# Patient Record
Sex: Female | Born: 1960 | Race: White | Hispanic: No | Smoking: Former smoker
Health system: Southern US, Community
[De-identification: ages and names within clinical notes are randomized; demographics above are authoritative.]

## PROBLEM LIST (undated history)

## (undated) DIAGNOSIS — I10 Essential (primary) hypertension: Secondary | ICD-10-CM

## (undated) DIAGNOSIS — F32A Depression, unspecified: Secondary | ICD-10-CM

## (undated) DIAGNOSIS — R011 Cardiac murmur, unspecified: Secondary | ICD-10-CM

## (undated) DIAGNOSIS — F329 Major depressive disorder, single episode, unspecified: Secondary | ICD-10-CM

## (undated) DIAGNOSIS — K219 Gastro-esophageal reflux disease without esophagitis: Secondary | ICD-10-CM

## (undated) DIAGNOSIS — M1990 Unspecified osteoarthritis, unspecified site: Secondary | ICD-10-CM

## (undated) HISTORY — PX: CAPSULOTOMY METATARSOPHALANGEAL: SHX6614

## (undated) HISTORY — PX: HARDWARE REMOVAL: SHX979

---

## 1998-05-11 HISTORY — PX: BREAST BIOPSY: SHX20

## 2005-02-03 ENCOUNTER — Ambulatory Visit: Payer: Self-pay | Admitting: Family Medicine

## 2005-03-25 ENCOUNTER — Other Ambulatory Visit: Payer: Self-pay

## 2005-03-26 ENCOUNTER — Ambulatory Visit: Payer: Self-pay | Admitting: Podiatry

## 2006-04-09 ENCOUNTER — Ambulatory Visit: Payer: Self-pay | Admitting: Podiatry

## 2011-10-06 ENCOUNTER — Ambulatory Visit: Payer: Self-pay | Admitting: Family Medicine

## 2013-08-10 ENCOUNTER — Ambulatory Visit: Payer: Self-pay

## 2014-07-09 HISTORY — PX: OSTECTOMY METATARSAL: SUR970

## 2014-09-18 ENCOUNTER — Other Ambulatory Visit: Payer: Self-pay

## 2014-09-18 DIAGNOSIS — Z1231 Encounter for screening mammogram for malignant neoplasm of breast: Secondary | ICD-10-CM

## 2014-09-20 ENCOUNTER — Ambulatory Visit
Admission: RE | Admit: 2014-09-20 | Discharge: 2014-09-20 | Disposition: A | Discharge status: Home / Self Care | Payer: BLUE CROSS/BLUE SHIELD | Source: Ambulatory Visit | Attending: Family Medicine | Admitting: Family Medicine

## 2014-09-20 DIAGNOSIS — Z1231 Encounter for screening mammogram for malignant neoplasm of breast: Secondary | ICD-10-CM | POA: Diagnosis present

## 2015-02-18 ENCOUNTER — Encounter: Payer: Self-pay | Admitting: General Surgery

## 2015-02-18 ENCOUNTER — Encounter: Payer: BLUE CROSS/BLUE SHIELD | Attending: General Surgery | Admitting: General Surgery

## 2015-02-18 DIAGNOSIS — S91101D Unspecified open wound of right great toe without damage to nail, subsequent encounter: Secondary | ICD-10-CM

## 2015-02-18 DIAGNOSIS — X58XXXS Exposure to other specified factors, sequela: Secondary | ICD-10-CM | POA: Diagnosis not present

## 2015-02-18 DIAGNOSIS — S91101S Unspecified open wound of right great toe without damage to nail, sequela: Secondary | ICD-10-CM | POA: Diagnosis not present

## 2015-02-18 DIAGNOSIS — M1990 Unspecified osteoarthritis, unspecified site: Secondary | ICD-10-CM | POA: Insufficient documentation

## 2015-02-18 DIAGNOSIS — I10 Essential (primary) hypertension: Secondary | ICD-10-CM | POA: Insufficient documentation

## 2015-02-18 DIAGNOSIS — S91101A Unspecified open wound of right great toe without damage to nail, initial encounter: Secondary | ICD-10-CM | POA: Insufficient documentation

## 2015-02-18 NOTE — Progress Notes (Signed)
seeiheal 

## 2015-02-19 NOTE — Progress Notes (Addendum)
[name] [name], [name] [name] ([id number]) Visit Report for 02/18/2015 Chief Complaint Document Details Patient Name: [name] [name], [name] [name] 02/18/2015 1:45 Date of Service: PM Medical Record [id number] Number: Patient Account Number: [id number] August 06, 1960 (54 y.o. Treating RN: Clover Mealy, RN, BSN, [place] Sink Date of Birth/Sex: Female) Other Clinician: Primary Care Physician: WHITE, ELIZABETH Treating Mycala Warshawsky Referring Physician: Doristine Mango Physician/Extender: Weeks in Treatment: 0 Information Obtained from: Patient Electronic Signature(s) Signed: 02/18/2015 5:36:04 PM By: Ardath Sax MD Signed: 02/19/2015 8:06:26 AM By: Evlyn Kanner MD, FACS Entered By: Ardath Sax on 02/18/2015 16:51:14 [name] [name] ([id number]) -------------------------------------------------------------------------------- HPI Details Patient Name: [name] [name], [name] [name] 02/18/2015 1:45 Date of Service: PM Medical Record [id number] Number: Patient Account Number: [id number] Apr 19, 1961 (54 y.o. Treating RN: Clover Mealy, RN, BSN, [place] Sink Date of Birth/Sex: Female) Other Clinician: Primary Care Physician: WHITE, ELIZABETH Treating Evlyn Kanner Referring Physician: Doristine Mango Physician/Extender: Weeks in Treatment: 0 Electronic Signature(s) Signed: 02/18/2015 5:36:04 PM By: Ardath Sax MD Signed: 02/19/2015 8:06:26 AM By: Evlyn Kanner MD, FACS Entered By: Ardath Sax on 02/18/2015 16:51:20 AMAYRA, KIEDROWSKI ([id number]) -------------------------------------------------------------------------------- Physical Exam Details Patient Name: [name] [name], [name] [name] 02/18/2015 1:45 Date of Service: PM Medical Record [id number] Number: Patient Account Number: [id number] 01-02-1961 (54 y.o. Treating RN: Clover Mealy, RN, BSN, [place] Sink Date of Birth/Sex: Female) Other Clinician: Primary Care Physician: WHITE, ELIZABETH Treating Evlyn Kanner Referring Physician: Doristine Mango Physician/Extender: Weeks in Treatment: 0 Electronic  Signature(s) Signed: 02/18/2015 5:36:04 PM By: Ardath Sax MD Signed: 02/19/2015 8:06:26 AM By: Evlyn Kanner MD, FACS Entered By: Ardath Sax on 02/18/2015 16:51:28 [name] [name], [name] [name] ([id number]) -------------------------------------------------------------------------------- Physician Orders Details Patient Name: [name] [name], [name] [name] 02/18/2015 1:45 Date of Service: PM Medical Record [id number] Number: Patient Account Number: [id number] August 30, 1960 (54 y.o. Treating RN: Huel Coventry Date of Birth/Sex: Female) Other Clinician: Primary Care Physician: WHITE, ELIZABETH Treating Evlyn Kanner Referring Physician: Doristine Mango Physician/Extender: Tania Ade in Treatment: 0 Verbal / Phone Orders: Yes Clinician: Huel Coventry Read Back and Verified: Yes Diagnosis Coding Wound Cleansing Wound #1 Right,Medial Metatarsal head first o Clean wound with Normal Saline. Anesthetic Wound #1 Right,Medial Metatarsal head first o Topical Lidocaine 4% cream applied to wound bed prior to debridement Primary Wound Dressing Wound #1 Right,Medial Metatarsal head first o Iodoform packing Gauze Secondary Dressing Wound #1 Right,Medial Metatarsal head first o Boardered Foam Dressing Dressing Change Frequency Wound #1 Right,Medial Metatarsal head first o Change dressing every day. Follow-up Appointments Wound #1 Right,Medial Metatarsal head first o Return Appointment in 1 week. Electronic Signature(s) Signed: 02/18/2015 5:41:06 PM By: Elliot Gurney RN, BSN, Kim RN, BSN Signed: 02/19/2015 8:06:26 AM By: Evlyn Kanner MD, FACS Entered By: Elliot Gurney RN, BSN, Kim on 02/18/2015 14:56:18 LEO, WEYANDT ([id number]) -------------------------------------------------------------------------------- Problem List Details Patient Name: [name] [name], [name] [name] 02/18/2015 1:45 Date of Service: PM Medical Record [id number] Number: Patient Account Number: [id number] 1961/03/11 (54 y.o. Treating RN: Clover Mealy, RN, BSN,  [place] Sink Date of Birth/Sex: Female) Other Clinician: Primary Care Physician: WHITE, ELIZABETH Treating Jimmey Ralph, PETER Referring Physician: Doristine Mango Physician/Extender: Weeks in Treatment: 0 Active Problems ICD-10 Encounter Code Description Active Date Diagnosis S91.101S Unspecified open wound of right great toe without 02/18/2015 Yes damage to nail, sequela Inactive Problems Resolved Problems Electronic Signature(s) Signed: 02/18/2015 5:36:04 PM By: Ardath Sax MD Entered By: Ardath Sax on 02/18/2015 17:34:18 [name] [name] ([id number]) -------------------------------------------------------------------------------- Progress Note Details Patient Name: [name] [name] 02/18/2015 1:45 Date of Service: PM Medical Record [id number] Number: Patient Account Number: [id number] [birth year] (54 y.o. Treating RN: Clover Mealy, RN, BSN, [place] Sink Date of Birth/Sex: Female) Other Clinician: Primary  Care Physician: WHITE, ELIZABETH Treating Evlyn Kanner Referring Physician: Doristine Mango Physician/Extender: Weeks in Treatment: 0 Subjective Chief Complaint Information obtained from Patient Wound History Patient presents with 1 open wound that has been present for approximately 6weeks. Patient has been treating wound in the following manner: neosporin. Laboratory tests have not been performed in the last month. Patient reportedly has not tested positive for an antibiotic resistant organism. Patient reportedly has not tested positive for osteomyelitis. Patient reportedly has not had testing performed to evaluate circulation in the legs. Patient History Information obtained from Patient. Allergies oxycodone (Severity: Moderate, Reaction: rash), Aleve (Severity: Moderate, Reaction: rash) Family History Cancer - Mother, Diabetes - Father, Heart Disease - Mother, Father, Hypertension - Father, Lung Disease - Father, Thyroid Problems - Mother, No family history of Kidney Disease,  Seizures, Stroke. Social History Current every day smoker, Marital Status - Divorced, Alcohol Use - Moderate, Drug Use - No History, Caffeine Use - Daily. Medical History Eyes Denies history of Cataracts, Glaucoma, Optic Neuritis Ear/Nose/Mouth/Throat Denies history of Chronic sinus problems/congestion Hematologic/Lymphatic Denies history of Anemia, Hemophilia, Human Immunodeficiency Virus, Lymphedema, Sickle Cell Disease Respiratory Denies history of Aspiration, Asthma, Chronic Obstructive Pulmonary Disease (COPD), Pneumothorax, Sleep Apnea, Tuberculosis [name] [name], [name] L. ([id number]) Cardiovascular Patient has history of Hypertension Denies history of Angina, Arrhythmia, Congestive Heart Failure, Coronary Artery Disease, Deep Vein Thrombosis, Hypotension, Myocardial Infarction, Peripheral Arterial Disease, Peripheral Venous Disease, Phlebitis, Vasculitis Gastrointestinal Denies history of Cirrhosis , Colitis, Crohn s, Hepatitis A, Hepatitis B, Hepatitis C Endocrine Denies history of Type I Diabetes, Type II Diabetes Genitourinary Denies history of End Stage Renal Disease Immunological Denies history of Lupus Erythematosus, Raynaud s, Scleroderma Integumentary (Skin) Denies history of History of Burn, History of pressure wounds Musculoskeletal Patient has history of Osteoarthritis - hands; feet, knee Denies history of Gout, Rheumatoid Arthritis, Osteomyelitis Neurologic Denies history of Dementia, Neuropathy, Quadriplegia, Paraplegia, Seizure Disorder Oncologic Denies history of Received Chemotherapy, Received Radiation Psychiatric Denies history of Anorexia/bulimia, Confinement Anxiety Hospitalization/Surgery History - 07/09/2014, foot surgery. Medical And Surgical History Notes Constitutional Symptoms (General Health) HTN; heart murmur; depression; Review of Systems (ROS) Constitutional Symptoms (General Health) The patient has no complaints or symptoms. Eyes The  patient has no complaints or symptoms. Ear/Nose/Mouth/Throat The patient has no complaints or symptoms. Hematologic/Lymphatic The patient has no complaints or symptoms. Respiratory The patient has no complaints or symptoms. Cardiovascular The patient has no complaints or symptoms. Gastrointestinal The patient has no complaints or symptoms. Endocrine The patient has no complaints or symptoms. Genitourinary The patient has no complaints or symptoms. Immunological [name] [name], [name] [name] ([id number]) The patient has no complaints or symptoms. Integumentary (Skin) Complains or has symptoms of Wounds, Bleeding or bruising tendency. Denies complaints or symptoms of Breakdown, Swelling. Musculoskeletal The patient has no complaints or symptoms. Neurologic The patient has no complaints or symptoms. Oncologic The patient has no complaints or symptoms. Psychiatric Denies complaints or symptoms of Anxiety, Claustrophobia. Objective Constitutional Vitals Time Taken: 2:06 PM, Height: 67 in, Weight: 198 lbs, BMI: 31, Temperature: 98.6 F, Pulse: 77 bpm, Respiratory Rate: 18 breaths/min, Blood Pressure: 114/66 mmHg. Integumentary (Hair, Skin) Wound #1 status is Open. Original cause of wound was Blister. The wound is located on the Right,Medial Metatarsal head first. The wound measures 0.6cm length x 0.5cm width x 0.2cm depth; 0.236cm^2 area and 0.047cm^3 volume. The wound is limited to skin breakdown. There is undermining starting at 5:00 and ending at 7:00 with a maximum distance of 0.5cm. There is a none present amount of  drainage noted. The wound margin is indistinct and nonvisible. There is no granulation within the wound bed. There is a small (1-33%) amount of necrotic tissue within the wound bed including Adherent Slough. The periwound skin appearance did not exhibit: Callus, Crepitus, Excoriation, Fluctuance, Friable, Induration, Localized Edema, Rash, Scarring, Dry/Scaly, Maceration,  Moist, Atrophie Blanche, Cyanosis, Ecchymosis, Hemosiderin Staining, Mottled, Pallor, Rubor, Erythema. Assessment Active Problems ICD-10 S91.101S - Unspecified open wound of right great toe without damage to nail, sequela [name] [name], [name] L. ([id number]) Procedures DEbrided blister right medial foot. Treat with silver collagen Plan Wound Cleansing: Wound #1 Right,Medial Metatarsal head first: Clean wound with Normal Saline. Anesthetic: Wound #1 Right,Medial Metatarsal head first: Topical Lidocaine 4% cream applied to wound bed prior to debridement Primary Wound Dressing: Wound #1 Right,Medial Metatarsal head first: Iodoform packing Gauze Secondary Dressing: Wound #1 Right,Medial Metatarsal head first: Boardered Foam Dressing Dressing Change Frequency: Wound #1 Right,Medial Metatarsal head first: Change dressing every day. Follow-up Appointments: Wound #1 Right,Medial Metatarsal head first: Return Appointment in 1 week. Follow-Up Appointments: A follow-up appointment should be scheduled. Medication Reconciliation completed and provided to Patient/Care Provider. A Patient Clinical Summary of Care was provided to CE Electronic Signature(s) Signed: 02/22/2015 4:14:39 PM By: Evlyn Kanner MD, FACS Previous Signature: 02/22/2015 4:14:30 PM Version By: Evlyn Kanner MD, FACS Previous Signature: 02/18/2015 5:36:04 PM Version By: Ardath Sax MD Previous Signature: 02/19/2015 8:06:26 AM Version By: Evlyn Kanner MD, FACS Entered By: Evlyn Kanner on 02/22/2015 16:14:39 [name] [name] ([id number]) -------------------------------------------------------------------------------- ROS/PFSH Details Patient Name: [name] [name], [name] [name] 02/18/2015 1:45 Date of Service: PM Medical Record [id number] Number: Patient Account Number: [id number] January 08, 1961 (53 y.o. Treating RN: Huel Coventry Date of Birth/Sex: Female) Other Clinician: Primary Care Physician: WHITE, ELIZABETH Treating Nakeeta Sebastiani,  Jarica Plass Referring Physician: WHITE, Lanora Manis Physician/Extender: Weeks in Treatment: 0 Information Obtained From Patient Wound History Do you currently have one or more open woundso Yes How many open wounds do you currently haveo 1 Approximately how long have you had your woundso 6weeks How have you been treating your wound(s) until nowo neosporin Has your wound(s) ever healed and then re-openedo No Have you had any lab work done in the past montho No Have you tested positive for an antibiotic resistant organism (MRSA, VRE)o No Have you tested positive for osteomyelitis (bone infection)o No Have you had any tests for circulation on your legso No Cardiovascular Complaints and Symptoms: No Complaints or Symptoms Complaints and Symptoms: Negative for: Chest pain; LE edema Medical History: Positive for: Hypertension Negative for: Angina; Arrhythmia; Congestive Heart Failure; Coronary Artery Disease; Deep Vein Thrombosis; Hypotension; Myocardial Infarction; Peripheral Arterial Disease; Peripheral Venous Disease; Phlebitis; Vasculitis Integumentary (Skin) Complaints and Symptoms: Positive for: Wounds; Bleeding or bruising tendency Negative for: Breakdown; Swelling Medical History: Negative for: History of Burn; History of pressure wounds Psychiatric Complaints and Symptoms: Negative for: Anxiety; Claustrophobia Panella, Vivia L. ([id number]) Medical History: Negative for: Anorexia/bulimia; Confinement Anxiety Constitutional Symptoms (General Health) Complaints and Symptoms: No Complaints or Symptoms Medical History: Past Medical History Notes: HTN; heart murmur; depression; Eyes Complaints and Symptoms: No Complaints or Symptoms Medical History: Negative for: Cataracts; Glaucoma; Optic Neuritis Ear/Nose/Mouth/Throat Complaints and Symptoms: No Complaints or Symptoms Medical History: Negative for: Chronic sinus problems/congestion Hematologic/Lymphatic Complaints and  Symptoms: No Complaints or Symptoms Medical History: Negative for: Anemia; Hemophilia; Human Immunodeficiency Virus; Lymphedema; Sickle Cell Disease Respiratory Complaints and Symptoms: No Complaints or Symptoms Medical History: Negative for: Aspiration; Asthma; Chronic Obstructive Pulmonary Disease (COPD); Pneumothorax; Sleep Apnea; Tuberculosis Gastrointestinal Complaints and Symptoms:  No Complaints or Symptoms Medical HistoryRINDI, BEECHY ([id number]) Negative for: Cirrhosis ; Colitis; Crohnos; Hepatitis A; Hepatitis B; Hepatitis C Endocrine Complaints and Symptoms: No Complaints or Symptoms Medical History: Negative for: Type I Diabetes; Type II Diabetes Genitourinary Complaints and Symptoms: No Complaints or Symptoms Medical History: Negative for: End Stage Renal Disease Immunological Complaints and Symptoms: No Complaints or Symptoms Medical History: Negative for: Lupus Erythematosus; Raynaudos; Scleroderma Musculoskeletal Complaints and Symptoms: No Complaints or Symptoms Medical History: Positive for: Osteoarthritis - hands; feet, knee Negative for: Gout; Rheumatoid Arthritis; Osteomyelitis Neurologic Complaints and Symptoms: No Complaints or Symptoms Medical History: Negative for: Dementia; Neuropathy; Quadriplegia; Paraplegia; Seizure Disorder Oncologic Complaints and Symptoms: No Complaints or Symptoms Medical History: Negative for: Received Chemotherapy; Received Radiation Hospitalization / Surgery History MARISOL, GIAMBRA ([id number]) Name of Hospital Purpose of Hospitalization/Surgery Date foot surgery 07/09/2014 Family and Social History Cancer: Yes - Mother; Diabetes: Yes - Father; Heart Disease: Yes - Mother, Father; Hypertension: Yes - Father; Kidney Disease: No; Lung Disease: Yes - Father; Seizures: No; Stroke: No; Thyroid Problems: Yes - Mother; Current every day smoker; Marital Status - Divorced; Alcohol Use: Moderate; Drug Use:  No History; Caffeine Use: Daily; Advanced Directives: No; Living Will: No; Medical Power of Attorney: No Electronic Signature(s) Signed: 02/18/2015 5:41:06 PM By: Elliot Gurney RN, BSN, Kim RN, BSN Signed: 02/19/2015 8:06:26 AM By: Evlyn Kanner MD, FACS Entered By: Elliot Gurney RN, BSN, Kim on 02/18/2015 14:24:16 [name] [name] ([id number]) -------------------------------------------------------------------------------- SuperBill Details Patient Name: [name] [name] Date of Service: 02/18/2015 Medical Record Patient Account Number: [id number] Number: Afful, RN, BSN, Treating RN: Dec 13, 1960 (53 y.o. [place] Sink Date of Birth/Sex: Female) Other Clinician: Primary Care Physician: WHITE, ELIZABETH Treating Jimmey Ralph, PETER Referring Physician: WHITE, ELIZABETH Physician/Extender: Weeks in Treatment: 0 Diagnosis Coding ICD-10 Codes Code Description S91.101S Unspecified open wound of right great toe without damage to nail, sequela Facility Procedures CPT4 Code: [id number] Description: 99213 - WOUND CARE VISIT-LEV 3 EST PT Modifier: Quantity: 1 Physician Procedures CPT4 Code Description: [id number] - WC PHYS LEVEL 2 - NEW PT ICD-10 Description Diagnosis S91.101S Unspecified open wound of right great toe without Modifier: damage to nai Quantity: 1 l, sequela Electronic Signature(s) Signed: 02/18/2015 5:36:04 PM By: Ardath Sax MD Entered By: Ardath Sax on 02/18/2015 17:04:08

## 2015-02-19 NOTE — Progress Notes (Signed)
[name] [name], [name] [name] ([id number]) Visit Report for 02/18/2015 Allergy List Details Patient Name: [name] [name], [name] [name] Date of Service: 02/18/2015 1:45 PM Medical Record Number: [id number] Patient Account Number: [id number] Date of Birth/Sex: 10/05/1960 (54 y.o. Female) Treating RN: Huel Coventry Primary Care Physician: Doristine Mango Other Clinician: Referring Physician: Doristine Mango Treating Physician/Extender: Rudene Re in Treatment: 0 Allergies Active Allergies oxycodone Reaction: rash Severity: Moderate Aleve Reaction: rash Severity: Moderate Allergy Notes Electronic Signature(s) Signed: 02/18/2015 5:41:06 PM By: Elliot Gurney, RN, BSN, Kim RN, BSN Entered By: Elliot Gurney, RN, BSN, Kim on 02/18/2015 14:19:27 [name] [name] ([id number]) -------------------------------------------------------------------------------- Arrival Information Details Patient Name: [name] [name] Date of Service: 02/18/2015 1:45 PM Medical Record Number: [id number] Patient Account Number: [id number] Date of Birth/Sex: Jan 30, 1961 (53 y.o. Female) Treating RN: Huel Coventry Primary Care Physician: Doristine Mango Other Clinician: Referring Physician: Doristine Mango Treating Physician/Extender: Rudene Re in Treatment: 0 Visit Information Patient Arrived: Ambulatory Arrival Time: 14:05 Accompanied By: self Transfer Assistance: Manual Patient Identification Verified: Yes Secondary Verification Process Yes Completed: Patient Has Alerts: Yes Patient Alerts: Patient on Blood Thinner NOT [employer] Electronic Signature(s) Signed: 02/18/2015 5:36:04 PM By: Ardath Sax MD Entered By: Ardath Sax on 02/18/2015 14:15:07 [name] [name] ([id number]) -------------------------------------------------------------------------------- Clinic Level of Care Assessment Details Patient Name: [name] [name] Date of Service: 02/18/2015 1:45 PM Medical Record Number: [id number] Patient Account  Number: [id number] Date of Birth/Sex: [birth year] (53 y.o. Female) Treating RN: Huel Coventry Primary Care Physician: Doristine Mango Other Clinician: Referring Physician: WHITE, Lanora Manis Treating Physician/Extender: Rudene Re in Treatment: 0 Clinic Level of Care Assessment Items TOOL 4 Quantity Score  - Use when only an EandM is performed on FOLLOW-UP visit 0 ASSESSMENTS - Nursing Assessment / Reassessment X - Reassessment of Co-morbidities (includes updates in patient status) 1 10 X - Reassessment of Adherence to Treatment Plan 1 5 ASSESSMENTS - Wound and Skin Assessment / Reassessment X - Simple Wound Assessment / Reassessment - one wound 1 5  - Complex Wound Assessment / Reassessment - multiple wounds 0  - Dermatologic / Skin Assessment (not related to wound area) 0 ASSESSMENTS - Focused Assessment  - Circumferential Edema Measurements - multi extremities 0  - Nutritional Assessment / Counseling / Intervention 0  - Lower Extremity Assessment (monofilament, tuning fork, pulses) 0  - Peripheral Arterial Disease Assessment (using hand held doppler) 0 ASSESSMENTS - Ostomy and/or Continence Assessment and Care  - Incontinence Assessment and Management 0  - Ostomy Care Assessment and Management (repouching, etc.) 0 PROCESS - Coordination of Care X - Simple Patient / Family Education for ongoing care 1 15  - Complex (extensive) Patient / Family Education for ongoing care 0 X - Staff obtains Chiropractor, Records, Test Results / Process Orders 1 10  - Staff telephones HHA, Nursing Homes / Clarify orders / etc 0  - Routine Transfer to another Facility (non-emergent condition) 0 Santy, Erum L. ([id number])  - Routine Hospital Admission (non-emergent condition) 0 X - New Admissions / Manufacturing engineer / Ordering NPWT, Apligraf, etc. 1 15  - Emergency Hospital Admission (emergent condition) 0 X - Simple Discharge Coordination 1 10  - Complex  (extensive) Discharge Coordination 0 PROCESS - Special Needs  - Pediatric / Minor Patient Management 0  - Isolation Patient Management 0  - Hearing / Language / Visual special needs 0  - Assessment of Community assistance (transportation, D/C planning, etc.) 0  - Additional assistance / Altered mentation 0  - Support Surface(s) Assessment (bed, cushion, seat, etc.) 0  INTERVENTIONS - Wound Cleansing / Measurement X - Simple Wound Cleansing - one wound 1 5 []  - Complex Wound Cleansing - multiple wounds 0 X - Wound Imaging (photographs - any number of wounds) 1 5 []  - Wound Tracing (instead of photographs) 0 X - Simple Wound Measurement - one wound 1 5 []  - Complex Wound Measurement - multiple wounds 0 INTERVENTIONS - Wound Dressings X - Small Wound Dressing one or multiple wounds 1 10 []  - Medium Wound Dressing one or multiple wounds 0 []  - Large Wound Dressing one or multiple wounds 0 []  - Application of Medications - topical 0 []  - Application of Medications - injection 0 INTERVENTIONS - Miscellaneous []  - External ear exam 0 [name] [name], [name] L. ([id number]) []  - Specimen Collection (cultures, biopsies, blood, body fluids, etc.) 0 []  - Specimen(s) / Culture(s) sent or taken to Lab for analysis 0 []  - Patient Transfer (multiple staff / Michiel Sites Lift / Similar devices) 0 []  - Simple Staple / Suture removal (25 or less) 0 []  - Complex Staple / Suture removal (26 or more) 0 []  - Hypo / Hyperglycemic Management (close monitor of Blood Glucose) 0 X - Ankle / Brachial Index (ABI) - do not check if billed separately 1 15 X - Vital Signs 1 5 Has the patient been seen at the hospital within the last three years: Yes Total Score: 115 Level Of Care: New/Established - Level 3 Electronic Signature(s) Signed: 02/18/2015 5:41:06 PM By: Elliot Gurney, RN, BSN, Kim RN, BSN Entered By: Elliot Gurney, RN, BSN, Kim on 02/18/2015 14:57:08 [name] [name]  ([id number]) -------------------------------------------------------------------------------- Encounter Discharge Information Details Patient Name: [name] [name] Date of Service: 02/18/2015 1:45 PM Medical Record Number: [id number] Patient Account Number: [id number] Date of Birth/Sex: Mar 05, 1961 (53 y.o. Female) Treating RN: Afful, RN, BSN, [place] Primary Care Physician: Doristine Mango Other Clinician: Referring Physician: Doristine Mango Treating Physician/Extender: Rudene Re in Treatment: 0 Encounter Discharge Information Items Discharge Pain Level: 0 Discharge Condition: Stable Ambulatory Status: Ambulatory Discharge Destination: Home Transportation: Private Auto Accompanied By: self Schedule Follow-up Appointment: Yes Medication Reconciliation completed and provided to Patient/Care Yes Renne Cornick: Provided on Clinical Summary of Care: 02/18/2015 Form Type Recipient Paper Patient CE Electronic Signature(s) Signed: 02/18/2015 5:36:04 PM By: Ardath Sax MD Previous Signature: 02/18/2015 2:53:48 PM Version By: Gwenlyn Perking Entered By: Ardath Sax on 02/18/2015 17:04:32 [name] [name] ([id number]) -------------------------------------------------------------------------------- Lower Extremity Assessment Details Patient Name: [name] [name] Date of Service: 02/18/2015 1:45 PM Medical Record Number: [id number] Patient Account Number: [id number] Date of Birth/Sex: 1960-08-28 (53 y.o. Female) Treating RN: Huel Coventry Primary Care Physician: Doristine Mango Other Clinician: Referring Physician: Doristine Mango Treating Physician/Extender: Rudene Re in Treatment: 0 Edema Assessment Assessed: [Left: No] [Right: No] Edema: [Left: N] [Right: o] Vascular Assessment Pulses: Posterior Tibial Palpable: [Right:Yes] Doppler: [Right:Multiphasic] Dorsalis Pedis Palpable: [Right:Yes] Doppler: [Right:Multiphasic] Extremity colors, hair growth, and  conditions: Extremity Color: [Right:Normal] Hair Growth on Extremity: [Right:No] Temperature of Extremity: [Right:Warm] Capillary Refill: [Right:< 3 seconds] Blood Pressure: Brachial: [Right:120] Dorsalis Pedis: [Left:Dorsalis Pedis: 100] Ankle: Posterior Tibial: [Left:Posterior Tibial: 90] [Right:0.83] Toe Nail Assessment Left: Right: Thick: No Discolored: No Deformed: No Improper Length and Hygiene: No Electronic Signature(s) Signed: 02/18/2015 5:41:06 PM By: Elliot Gurney, RN, BSN, Kim RN, BSN Entered By: Elliot Gurney, RN, BSN, Kim on 02/18/2015 14:11:10 [name] [name] ([id number]) -------------------------------------------------------------------------------- Multi Wound Chart Details Patient Name: [name] [name] Date of Service: 02/18/2015 1:45 PM Medical Record Number: [id number] Patient Account Number: [id number] Date of Birth/Sex: 04-30-1961 (53 y.o.  Female) Treating RN: Huel Coventry Primary Care Physician: Doristine Mango Other Clinician: Referring Physician: WHITE, Lanora Manis Treating Physician/Extender: Rudene Re in Treatment: 0 Vital Signs Height(in): 67 Pulse(bpm): 77 Weight(lbs): 198 Blood Pressure 114/66 (mmHg): Body Mass Index(BMI): 31 Temperature(F): 98.6 Respiratory Rate 18 (breaths/min): Photos: [1:No Photos] [N/A:N/A] Wound Location: [1:Right Metatarsal head first N/A - Medial] Wounding Event: [1:Blister] [N/A:N/A] Primary Etiology: [1:Pressure Ulcer] [N/A:N/A] Date Acquired: [1:01/14/2015] [N/A:N/A] Weeks of Treatment: [1:0] [N/A:N/A] Wound Status: [1:Open] [N/A:N/A] Measurements L x W x D 0.6x0.5x0.2 [N/A:N/A] (cm) Area (cm) : [1:0.236] [N/A:N/A] Volume (cm) : [1:0.047] [N/A:N/A] % Reduction in Area: [1:0.00%] [N/A:N/A] % Reduction in Volume: 0.00% [N/A:N/A] Starting Position 1 5 (o'clock): Ending Position 1 [1:7] (o'clock): Maximum Distance 1 0.5 (cm): Undermining: [1:Yes] [N/A:N/A] Classification: [1:Category/Stage II]  [N/A:N/A] Exudate Amount: [1:None Present] [N/A:N/A] Wound Margin: [1:Indistinct, nonvisible] [N/A:N/A] Granulation Amount: [1:None Present (0%)] [N/A:N/A] Necrotic Amount: [1:Small (1-33%)] [N/A:N/A] Exposed Structures: [1:Fascia: No Fat: No Tendon: No Muscle: No] [N/A:N/A] Joint: No Bone: No Limited to Skin Breakdown Periwound Skin Texture: Edema: No N/A N/A Excoriation: No Induration: No Callus: No Crepitus: No Fluctuance: No Friable: No Rash: No Scarring: No Periwound Skin Maceration: No N/A N/A Moisture: Moist: No Dry/Scaly: No Periwound Skin Color: Atrophie Blanche: No N/A N/A Cyanosis: No Ecchymosis: No Erythema: No Hemosiderin Staining: No Mottled: No Pallor: No Rubor: No Tenderness on No N/A N/A Palpation: Wound Preparation: Ulcer Cleansing: N/A N/A Rinsed/Irrigated with Saline Topical Anesthetic Applied: Other: liodocaine 4% Treatment Notes Electronic Signature(s) Signed: 02/18/2015 5:41:06 PM By: Elliot Gurney, RN, BSN, Kim RN, BSN Entered By: Elliot Gurney, RN, BSN, Kim on 02/18/2015 14:28:53 [name] [name] ([id number]) -------------------------------------------------------------------------------- Multi-Disciplinary Care Plan Details Patient Name: [name] [name] Date of Service: 02/18/2015 1:45 PM Medical Record Number: [id number] Patient Account Number: [id number] Date of Birth/Sex: 08/10/1960 (53 y.o. Female) Treating RN: Huel Coventry Primary Care Physician: Doristine Mango Other Clinician: Referring Physician: WHITE, Lanora Manis Treating Physician/Extender: Rudene Re in Treatment: 0 Active Inactive Orientation to the Wound Care Program Nursing Diagnoses: Knowledge deficit related to the wound healing center program Goals: Patient/caregiver will verbalize understanding of the Wound Healing Center Program Date Initiated: 02/18/2015 Goal Status: Active Interventions: Provide education on orientation to the wound  center Notes: Pressure Nursing Diagnoses: Knowledge deficit related to management of pressures ulcers Potential for impaired tissue integrity related to pressure, friction, moisture, and shear Goals: Patient will remain free of pressure ulcers Date Initiated: 02/18/2015 Goal Status: Active Interventions: Assess potential for pressure ulcer upon admission and as needed Notes: Wound/Skin Impairment Nursing Diagnoses: Knowledge deficit related to ulceration/compromised skin integrity Goals: Ulcer/skin breakdown will heal within 14 weeks [name] [name], [name] L. ([id number]) Date Initiated: 02/18/2015 Goal Status: Active Interventions: Assess patient/caregiver ability to perform ulcer/skin care regimen upon admission and as needed Treatment Activities: Topical wound management initiated : 02/18/2015 Notes: Electronic Signature(s) Signed: 02/18/2015 5:41:06 PM By: Elliot Gurney, RN, BSN, Kim RN, BSN Entered By: Elliot Gurney, RN, BSN, Kim on 02/18/2015 14:28:30 [name] [name] ([id number]) -------------------------------------------------------------------------------- Pain Assessment Details Patient Name: [name] [name] Date of Service: 02/18/2015 1:45 PM Medical Record Number: [id number] Patient Account Number: [id number] Date of Birth/Sex: 09-29-1960 (54 y.o. Female) Treating RN: Huel Coventry Primary Care Physician: Doristine Mango Other Clinician: Referring Physician: Doristine Mango Treating Physician/Extender: Rudene Re in Treatment: 0 Active Problems Location of Pain Severity and Description of Pain Patient Has Paino No Site Locations Pain Management and Medication Current Pain Management: Electronic Signature(s) Signed: 02/18/2015 5:41:06 PM By: Elliot Gurney, RN, BSN, Kim RN, BSN Entered By:  Elliot Gurney, RN, BSN, Kim on 02/18/2015 14:06:42 [name] [name], [name] [name] ([id number]) -------------------------------------------------------------------------------- Patient/Caregiver Education  Details Patient Name: [name] [name] Date of Service: 02/18/2015 1:45 PM Medical Record Number: [id number] Patient Account Number: [id number] Date of Birth/Gender: 01-Feb-1961 (54 y.o. Female) Treating RN: Huel Coventry Primary Care Physician: Doristine Mango Other Clinician: Referring Physician: Doristine Mango Treating Physician/Extender: Rudene Re in Treatment: 0 Education Assessment Education Provided To: Patient Education Topics Provided Wound/Skin Impairment: Handouts: Caring for Your Ulcer, Other: offloading and wound care as prescribed Electronic Signature(s) Signed: 02/18/2015 5:36:04 PM By: Ardath Sax MD Entered By: Ardath Sax on 02/18/2015 17:04:44 [name] [name] ([id number]) -------------------------------------------------------------------------------- Wound Assessment Details Patient Name: [name] [name] Date of Service: 02/18/2015 1:45 PM Medical Record Number: [id number] Patient Account Number: [id number] Date of Birth/Sex: [birth year] (54 y.o. Female) Treating RN: Huel Coventry Primary Care Physician: Doristine Mango Other Clinician: Referring Physician: WHITE, Lanora Manis Treating Physician/Extender: Rudene Re in Treatment: 0 Wound Status Wound Number: 1 Primary Etiology: Pressure Ulcer Wound Location: Right Metatarsal head first - Wound Status: Open Medial Wounding Event: Blister Date Acquired: 01/14/2015 Weeks Of Treatment: 0 Clustered Wound: No Photos Photo Uploaded By: Elliot Gurney, RN, BSN, Kim on 02/18/2015 18:18:45 Wound Measurements Length: (cm) 0.6 Width: (cm) 0.5 Depth: (cm) 0.2 Area: (cm) 0.236 Volume: (cm) 0.047 % Reduction in Area: 0% % Reduction in Volume: 0% Undermining: Yes Starting Position (o'clock): 5 Ending Position (o'clock): 7 Maximum Distance: (cm) 0.5 Wound Description Classification: Category/Stage II Wound Margin: Indistinct, nonvisible Exudate Amount: None Present Wound Bed Granulation  Amount: None Present (0%) Exposed Structure Necrotic Amount: Small (1-33%) Fascia Exposed: No Necrotic Quality: Adherent Slough Fat Layer Exposed: No [name] [name], [name] L. ([id number]) Tendon Exposed: No Muscle Exposed: No Joint Exposed: No Bone Exposed: No Limited to Skin Breakdown Periwound Skin Texture Texture Color No Abnormalities Noted: No No Abnormalities Noted: No Callus: No Atrophie Blanche: No Crepitus: No Cyanosis: No Excoriation: No Ecchymosis: No Fluctuance: No Erythema: No Friable: No Hemosiderin Staining: No Induration: No Mottled: No Localized Edema: No Pallor: No Rash: No Rubor: No Scarring: No Moisture No Abnormalities Noted: No Dry / Scaly: No Maceration: No Moist: No Wound Preparation Ulcer Cleansing: Rinsed/Irrigated with Saline Topical Anesthetic Applied: Other: liodocaine 4%, Treatment Notes Wound #1 (Right, Medial Metatarsal head first) 1. Cleansed with: Clean wound with Normal Saline 2. Anesthetic Topical Lidocaine 4% cream to wound bed prior to debridement 4. Dressing Applied: Iodoform packing Gauze 5. Secondary Dressing Applied Kerlix/Conform 6. Footwear/Offloading device applied Surgical shoe Notes felt surrounding wound Electronic Signature(s) Signed: 02/18/2015 5:41:06 PM By: Elliot Gurney, RN, BSN, Kim RN, BSN Entered By: Elliot Gurney, RN, BSN, Kim on 02/18/2015 14:17:34 [name] [name], [name] [name] ([id number]) [name] [name], [name] [name] ([id number]) -------------------------------------------------------------------------------- Vitals Details Patient Name: [name] [name] Date of Service: 02/18/2015 1:45 PM Medical Record Number: [id number] Patient Account Number: [id number] Date of Birth/Sex: October 21, 1960 (54 y.o. Female) Treating RN: Huel Coventry Primary Care Physician: Doristine Mango Other Clinician: Referring Physician: WHITE, Lanora Manis Treating Physician/Extender: Rudene Re in Treatment: 0 Vital Signs Time Taken: 14:06 Temperature (F):  98.6 Height (in): 67 Pulse (bpm): 77 Weight (lbs): 198 Respiratory Rate (breaths/min): 18 Body Mass Index (BMI): 31 Blood Pressure (mmHg): 114/66 Reference Range: 80 - 120 mg / dl Electronic Signature(s) Signed: 02/18/2015 5:41:06 PM By: Elliot Gurney, RN, BSN, Kim RN, BSN Entered By: Elliot Gurney, RN, BSN, Kim on 02/18/2015 14:07:16

## 2015-02-19 NOTE — Progress Notes (Signed)
[name] [name] ([id number]) Visit Report for 02/18/2015 Abuse/Suicide Risk Screen Details Patient Name: [name] [name], [name] [name] 02/18/2015 1:45 Date of Service: PM Medical Record [id number] Number: Patient Account Number: [id number] May 13, 1960 (54 y.o. Treating RN: Huel Coventry Date of Birth/Sex: Female) Other Clinician: Primary Care Physician: WHITE, ELIZABETH Treating Britto, Errol Referring Physician: WHITE, ELIZABETH Physician/Extender: Weeks in Treatment: 0 Abuse/Suicide Risk Screen Items Answer ABUSE/SUICIDE RISK SCREEN: Has anyone close to you tried to hurt or harm you recentlyo No Do you feel uncomfortable with anyone in your familyo No Has anyone forced you do things that you didnot want to doo No Do you have any thoughts of harming yourselfo No Patient displays signs or symptoms of abuse and/or neglect. No Electronic Signature(s) Signed: 02/18/2015 5:41:06 PM By: Elliot Gurney, RN, BSN, Kim RN, BSN Entered By: Elliot Gurney, RN, BSN, Kim on 02/18/2015 14:24:25 [name] [name] ([id number]) -------------------------------------------------------------------------------- Activities of Daily Living Details Patient Name: [name] [name] 02/18/2015 1:45 Date of Service: PM Medical Record [id number] Number: Patient Account Number: [id number] August 12, 1960 (53 y.o. Treating RN: Huel Coventry Date of Birth/Sex: Female) Other Clinician: Primary Care Physician: WHITE, ELIZABETH Treating Evlyn Kanner Referring Physician: Doristine Mango Physician/Extender: Weeks in Treatment: 0 Activities of Daily Living Items Answer Activities of Daily Living (Please select one for each item) Drive Automobile Completely Able Take Medications Completely Able Use Telephone Completely Able Care for Appearance Completely Able Use Toilet Completely Able Bath / Shower Completely Able Dress Self Completely Able Feed Self Completely Able Walk Completely Able Get In / Out Bed Completely Able Housework Completely  Able Prepare Meals Completely Able Handle Money Completely Able Shop for Self Completely Able Electronic Signature(s) Signed: 02/18/2015 5:41:06 PM By: Elliot Gurney, RN, BSN, Kim RN, BSN Entered By: Elliot Gurney, RN, BSN, Kim on 02/18/2015 14:24:39 [name] [name] ([id number]) -------------------------------------------------------------------------------- Education Assessment Details Patient Name: [name] [name] 02/18/2015 1:45 Date of Service: PM Medical Record [id number] Number: Patient Account Number: [id number] 11-17-1960 (53 y.o. Treating RN: Huel Coventry Date of Birth/Sex: Female) Other Clinician: Primary Care Physician: WHITE, ELIZABETH Treating Evlyn Kanner Referring Physician: Doristine Mango Physician/Extender: Tania Ade in Treatment: 0 Learning Preferences/Education Level/Primary Language Learning Preference: Explanation, Demonstration Highest Education Level: College or Above Preferred Language: English Cognitive Barrier Assessment/Beliefs Language Barrier: No Translator Needed: No Memory Deficit: No Emotional Barrier: No Cultural/Religious Beliefs Affecting Medical No Care: Physical Barrier Assessment Impaired Vision: No Impaired Hearing: No Decreased Hand dexterity: No Knowledge/Comprehension Assessment Knowledge Level: High Comprehension Level: High Ability to understand written High instructions: Ability to understand verbal High instructions: Motivation Assessment Anxiety Level: Calm Cooperation: Cooperative Education Importance: Acknowledges Need Interest in Health Problems: Asks Questions Perception: Coherent Willingness to Engage in Self- High Management Activities: Readiness to Engage in Self- High Management Activities: CYNIA, ABRUZZO ([id number]) Electronic Signature(s) Signed: 02/18/2015 5:41:06 PM By: Elliot Gurney, RN, BSN, Kim RN, BSN Entered By: Elliot Gurney, RN, BSN, Kim on 02/18/2015 14:25:21 [name] [name]  ([id number]) -------------------------------------------------------------------------------- Fall Risk Assessment Details Patient Name: [name] [name] 02/18/2015 1:45 Date of Service: PM Medical Record [id number] Number: Patient Account Number: [id number] [birth year] (53 y.o. Treating RN: Huel Coventry Date of Birth/Sex: Female) Other Clinician: Primary Care Physician: WHITE, ELIZABETH Treating Britto, Errol Referring Physician: WHITE, Lanora Manis Physician/Extender: Weeks in Treatment: 0 Fall Risk Assessment Items FALL RISK ASSESSMENT: History of falling - immediate or within 3 months 25 Yes Secondary diagnosis 0 No Ambulatory aid None/bed rest/wheelchair/nurse 0 Yes Crutches/cane/walker 0 No Furniture 0 No IV Access/Saline Lock 0 No Gait/Training Normal/bed rest/immobile 0 Yes Weak 0 No Impaired  0 No Mental Status Oriented to own ability 0 Yes Electronic Signature(s) Signed: 02/18/2015 5:41:06 PM By: Elliot Gurney, RN, BSN, Kim RN, BSN Entered By: Elliot Gurney, RN, BSN, Kim on 02/18/2015 14:25:28 [name] [name] ([id number]) -------------------------------------------------------------------------------- Foot Assessment Details Patient Name: [name] [name] 02/18/2015 1:45 Date of Service: PM Medical Record [id number] Number: Patient Account Number: [id number] [birth year] (53 y.o. Treating RN: Huel Coventry Date of Birth/Sex: Female) Other Clinician: Primary Care Physician: WHITE, ELIZABETH Treating Britto, Errol Referring Physician: WHITE, Lanora Manis Physician/Extender: Weeks in Treatment: 0 Foot Assessment Items Site Locations + = Sensation present, - = Sensation absent, C = Callus, U = Ulcer R = Redness, W = Warmth, M = Maceration, PU = Pre-ulcerative lesion F = Fissure, S = Swelling, D = Dryness Assessment Right: Left: Other Deformity: No No Prior Foot Ulcer: No No Prior Amputation: No No Charcot Joint: No No Ambulatory Status: Ambulatory Without Help Gait:  Steady Electronic Signature(s) Signed: 02/18/2015 5:41:06 PM By: Elliot Gurney, RN, BSN, Kim RN, BSN Entered By: Elliot Gurney, RN, BSN, Kim on 02/18/2015 14:26:08 [name] [name] ([id number]) -------------------------------------------------------------------------------- Nutrition Risk Assessment Details Patient Name: RAEGHAN, DEMETER 02/18/2015 1:45 Date of Service: PM Medical Record [id number] Number: Patient Account Number: [id number] 10/24/1960 (53 y.o. Treating RN: Huel Coventry Date of Birth/Sex: Female) Other Clinician: Primary Care Physician: WHITE, ELIZABETH Treating Britto, Errol Referring Physician: WHITE, ELIZABETH Physician/Extender: Weeks in Treatment: 0 Height (in): 67 Weight (lbs): 198 Body Mass Index (BMI): 31 Nutrition Risk Assessment Items NUTRITION RISK SCREEN: I have an illness or condition that made me change the kind and/or 0 No amount of food I eat I eat fewer than two meals per day 0 No I eat few fruits and vegetables, or milk products 0 No I have three or more drinks of beer, liquor or wine almost every day 0 No I have tooth or mouth problems that make it hard for me to eat 0 No I don't always have enough money to buy the food I need 0 No I eat alone most of the time 0 No I take three or more different prescribed or over-the-counter drugs a 0 No day Without wanting to, I have lost or gained 10 pounds in the last six 0 No months I am not always physically able to shop, cook and/or feed myself 0 No Nutrition Protocols Good Risk Protocol 0 No interventions needed Moderate Risk Protocol Electronic Signature(s) Signed: 02/18/2015 5:41:06 PM By: Elliot Gurney, RN, BSN, Kim RN, BSN Entered By: Elliot Gurney, RN, BSN, Kim on 02/18/2015 14:25:37

## 2015-02-25 ENCOUNTER — Encounter: Payer: BLUE CROSS/BLUE SHIELD | Admitting: Surgery

## 2015-02-25 DIAGNOSIS — S91101S Unspecified open wound of right great toe without damage to nail, sequela: Secondary | ICD-10-CM | POA: Diagnosis not present

## 2015-02-25 NOTE — Progress Notes (Addendum)
[name], [name] ([id number]) Visit Report for 02/25/2015 Chief Complaint Document Details Patient Name: [name] [name], [name] [name] 02/25/2015 3:45 Date of Service: PM Medical Record [id number] Number: Patient Account Number: [id number] 1961-04-15 (54 y.o. Treating RN: Cornell Barman Date of Birth/Sex: Female) Other Clinician: Primary Care Physician: WHITE, ELIZABETH Treating Christin Fudge Referring Physician: Eulogio Bear Physician/Extender: Weeks in Treatment: 1 Information Obtained from: Patient Chief Complaint Patient presents to the wound care center for a consult due non healing wound She's had a large callus on the medial part of her right forefoot and this has been there for over a month. Electronic Signature(s) Signed: 02/25/2015 4:31:59 PM By: Christin Fudge MD, FACS Previous Signature: 02/25/2015 4:04:17 PM Version By: Christin Fudge MD, FACS Entered By: Christin Fudge on 02/25/2015 16:31:59 [name] [name] ([id number]) -------------------------------------------------------------------------------- Debridement Details Patient Name: ARPI, DIEBOLD 02/25/2015 3:45 Date of Service: PM Medical Record [id number] Number: Patient Account Number: [id number] [birth year] (54 y.o. Treating RN: Cornell Barman Date of Birth/Sex: Female) Other Clinician: Primary Care Physician: WHITE, ELIZABETH Treating Mayah Urquidi Referring Physician: WHITE, Benjamine Mola Physician/Extender: Weeks in Treatment: 1 Debridement Performed for Wound #1 Right,Medial Metatarsal head first Assessment: Performed By: Physician Christin Fudge, MD Debridement: Debridement Pre-procedure Yes Verification/Time Out Taken: Start Time: 16:14 Pain Control: Other : lidocaine 4% Level: Skin/Subcutaneous Tissue Total Area Debrided (L x 0.4 (cm) x 0.2 (cm) = 0.08 (cm) W): Tissue and other Viable, Non-Viable, Exudate, Fibrin/Slough, Skin, Subcutaneous material debrided: Instrument: Forceps, Scissors Bleeding:  Minimum Hemostasis Achieved: Pressure End Time: 16:20 Procedural Pain: 1 Post Procedural Pain: 2 Response to Treatment: Procedure was tolerated well Post Debridement Measurements of Total Wound Length: (cm) 0.6 Stage: Category/Stage II Width: (cm) 0.6 Depth: (cm) 0.3 Volume: (cm) 0.085 Post Procedure Diagnosis Same as Pre-procedure Electronic Signature(s) Signed: 02/25/2015 4:31:30 PM By: Christin Fudge MD, FACS Signed: 02/25/2015 6:09:37 PM By: Gretta Cool, RN, BSN, Kim RN, BSN Entered By: Christin Fudge on 02/25/2015 16:31:30 [name] [name] ([id number]) -------------------------------------------------------------------------------- HPI Details Patient Name: [name], [name] 02/25/2015 3:45 Date of Service: PM Medical Record [id number] Number: Patient Account Number: [id number] Apr 04, 1961 (54 y.o. Treating RN: Cornell Barman Date of Birth/Sex: Female) Other Clinician: Primary Care Physician: WHITE, ELIZABETH Treating Christin Fudge Referring Physician: WHITE, Benjamine Mola Physician/Extender: Weeks in Treatment: 1 History of Present Illness Location: right forefoot medially near her bunion Quality: Patient reports experiencing a dull pain to affected area(s). Severity: Patient states wound are getting worse. Duration: Patient has had the wound for < 5 weeks prior to presenting for treatment Timing: Pain in wound is Intermittent (comes and goes Context: The wound occurred when the patient wore some ill fitting shoes Modifying Factors: she has had abnormality of her toes for a while and has seen her ankle and foot surgeon for the left side. Associated Signs and Symptoms: Patient reports presence of swelling HPI Description: 54 year old patient whose had abnormalities of both feet but her left foot was operated on by a foot and ankle surgeon and she had this in February of this year. He had taken some x-rays of the right foot but not address this deformity yet. She says about a month  ago she was ill fitting shoes for a while and due to this she had a large blister which then opened out and became an ulcer. She's also had some plantar warts on her right foot and has been applying some over-the-counter medication on this. She does not have diabetes mellitus or any other chronic problems but has been addicted to nicotine and has  been smoking about half a pack of cigarettes for about 4 years. Electronic Signature(s) Signed: 02/25/2015 4:35:08 PM By: Christin Fudge MD, FACS Previous Signature: 02/25/2015 4:04:22 PM Version By: Christin Fudge MD, FACS Entered By: Christin Fudge on 02/25/2015 16:35:08 [name] [name] ([id number]) -------------------------------------------------------------------------------- Physical Exam Details Patient Name: [name], [name] 02/25/2015 3:45 Date of Service: PM Medical Record [id number] Number: Patient Account Number: [id number] [birth year] (54 y.o. Treating RN: Cornell Barman Date of Birth/Sex: Female) Other Clinician: Primary Care Physician: WHITE, ELIZABETH Treating Christin Fudge Referring Physician: WHITE, Benjamine Mola Physician/Extender: Weeks in Treatment: 1 Constitutional . Pulse regular. Respirations normal and unlabored. Afebrile. . Eyes Nonicteric. Reactive to light. Ears, Nose, Mouth, and Throat Lips, teeth, and gums WNL.Marland Kitchen Moist mucosa without lesions . Neck supple and nontender. No palpable supraclavicular or cervical adenopathy. Normal sized without goiter. Respiratory WNL. No retractions.. Breath sounds WNL, No rubs, rales, rhonchi, or wheeze.. Cardiovascular Heart rhythm and rate regular, no murmur or gallop.. Pedal Pulses WNL. No clubbing, cyanosis or edema. Chest Breasts symmetical and no nipple discharge.. Breast tissue WNL, no masses, lumps, or tenderness.. Lymphatic No adneopathy. No adenopathy. No adenopathy. Musculoskeletal Adexa without tenderness or enlargement.. Digits and nails w/o clubbing, cyanosis,  infection, petechiae, ischemia, or inflammatory conditions.. Integumentary (Hair, Skin) No suspicious lesions. No crepitus or fluctuance. No peri-wound warmth or erythema. No masses.Marland Kitchen Psychiatric Judgement and insight Intact.. No evidence of depression, anxiety, or agitation.. Notes She's got a large bunion on her right forefoot at the head of the first metatarsal and this has a ulcerated area with overlying dead skin which need sharp debridement. Electronic Signature(s) Signed: 02/25/2015 4:35:59 PM By: Christin Fudge MD, FACS Entered By: Christin Fudge on 02/25/2015 16:35:59 [name] [name] ([id number]) -------------------------------------------------------------------------------- Physician Orders Details Patient Name: MALASHA, KLEPPE 02/25/2015 3:45 Date of Service: PM Medical Record [id number] Number: Patient Account Number: [id number] [birth year] (54 y.o. Treating RN: Cornell Barman Date of Birth/Sex: Female) Other Clinician: Primary Care Physician: WHITE, ELIZABETH Treating Christin Fudge Referring Physician: Eulogio Bear Physician/Extender: Weeks in Treatment: 1 Verbal / Phone Orders: Yes Clinician: Cornell Barman Read Back and Verified: Yes Diagnosis Coding ICD-10 Coding Code Description O53.664Q Unspecified open wound of right great toe without damage to nail, sequela Wound Cleansing Wound #1 Right,Medial Metatarsal head first o Clean wound with Normal Saline. o May Shower, gently pat wound dry prior to applying new dressing. Anesthetic Wound #1 Right,Medial Metatarsal head first o Topical Lidocaine 4% cream applied to wound bed prior to debridement Primary Wound Dressing Wound #1 Right,Medial Metatarsal head first o Iodoform packing Gauze Secondary Dressing Wound #1 Right,Medial Metatarsal head first o Boardered Foam Dressing Dressing Change Frequency Wound #1 Right,Medial Metatarsal head first o Change dressing every day. Follow-up  Appointments Wound #1 Right,Medial Metatarsal head first o Return Appointment in 1 week. Radiology o X-ray, foot - right first met head LISSET, KETCHEM ([id number]) oooo Electronic Signature(s) Signed: 02/25/2015 4:43:13 PM By: Christin Fudge MD, FACS Signed: 02/25/2015 6:09:37 PM By: Gretta Cool RN, BSN, Kim RN, BSN Entered By: Gretta Cool, RN, BSN, Kim on 02/25/2015 16:17:39 Scroggs, Rayvon Char ([id number]) -------------------------------------------------------------------------------- Problem List Details Patient Name: TANZIE, ROTHSCHILD 02/25/2015 3:45 Date of Service: PM Medical Record [id number] Number: Patient Account Number: [id number] 1960/08/25 (53 y.o. 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 M21.611 Bunion of right foot 02/25/2015 Yes F17.218 Nicotine dependence, cigarettes, with other nicotine- 02/25/2015 Yes induced disorders Inactive Problems Resolved Problems Electronic Signature(s) Signed: 02/25/2015 4:31:19 PM By: Christin Fudge MD, FACS Previous Signature: 02/25/2015 4:04:11 PM Version By: Christin Fudge MD, FACS Entered By: Christin Fudge on 02/25/2015 16:31:19 [name] [name] ([id number]) -------------------------------------------------------------------------------- Progress Note Details Patient Name: [name] [name] 02/25/2015 3:45 Date of Service: PM Medical Record [id number] Number: Patient Account Number: [id number] October 10, 1960 (53 y.o. Treating RN: Cornell Barman Date of Birth/Sex: Female) Other Clinician: Primary Care Physician: WHITE, ELIZABETH Treating Christin Fudge Referring Physician: Eulogio Bear Physician/Extender: Weeks in Treatment: 1 Subjective Chief Complaint Information obtained from Patient Patient presents to the wound care center for a consult due non healing wound She's had a large callus on the medial part of her right forefoot and this has been there for over a month. History of Present Illness (HPI) The following HPI elements were documented for the patient's wound: Location: right forefoot medially near her bunion Quality: Patient reports experiencing a dull pain to affected area(s). Severity: Patient states wound are getting worse. Duration: Patient has had the wound for < 5 weeks prior to presenting for treatment Timing: Pain in wound is Intermittent (comes and goes Context: The wound occurred when the patient wore some ill fitting shoes Modifying Factors: she has had abnormality of her toes for a while and has seen her ankle and foot surgeon for the left side. Associated Signs and Symptoms: Patient reports presence of swelling 54 year old patient whose had abnormalities of both feet but her left foot was operated on by a foot and ankle surgeon and she had this in February of this year. He had taken some x-rays of the right foot but not address this deformity yet. She says about a month ago she was ill fitting shoes for a while and due to this she had a large blister which then opened out and became an ulcer. She's also had some plantar warts on her right foot and has been applying some over-the-counter medication on this. She does not have diabetes mellitus or any other chronic problems but has been addicted to nicotine and has been smoking about half a pack of cigarettes for about 4 years. Objective Constitutional Pulse regular. Respirations normal and unlabored. Afebrile. Matsushita, Thelia L. ([id number]) Vitals Time Taken: 1:57 PM, Height: 67 in, Weight: 198 lbs, BMI: 31, Temperature: 98.6 F, Pulse: 84 bpm, Respiratory Rate: 18 breaths/min, Blood  Pressure: 104/67 mmHg. Eyes Nonicteric. Reactive to light. Ears, Nose, Mouth, and Throat Lips, teeth, and gums WNL.Marland Kitchen Moist mucosa without lesions . Neck supple and nontender. No palpable supraclavicular or cervical adenopathy. Normal sized without goiter. Respiratory WNL. No retractions.. Breath sounds WNL, No rubs, rales, rhonchi, or wheeze.. Cardiovascular Heart rhythm and rate regular, no murmur or gallop.. Pedal Pulses WNL. No clubbing, cyanosis or edema. Chest Breasts symmetical and no nipple discharge.. Breast tissue WNL, no masses, lumps, or tenderness.. Lymphatic No adneopathy. No adenopathy. No adenopathy. Musculoskeletal Adexa without tenderness or enlargement.. Digits and nails w/o clubbing, cyanosis, infection, petechiae, ischemia, or inflammatory conditions.Marland Kitchen Psychiatric Judgement and insight Intact.. No evidence of depression, anxiety, or agitation.. General Notes: She's got a large bunion on her right forefoot at the head of the first metatarsal and this has a ulcerated area with overlying dead skin which need sharp debridement. Integumentary (Hair, Skin) No suspicious lesions. No crepitus or fluctuance. No peri-wound warmth or erythema. No masses.Marland Kitchen  Wound #1 status is Open. Original cause of wound was Blister. The wound is located on the Right,Medial Metatarsal head first. The wound measures 0.4cm length x 0.2cm width x 0.4cm depth; 0.063cm^2 area and 0.025cm^3 volume. The wound is limited to skin breakdown. There is a none present amount of drainage noted. The wound margin is indistinct and nonvisible. There is no granulation within the wound bed. There is a small (1-33%) amount of necrotic tissue within the wound bed including Adherent Slough. The periwound skin appearance did not exhibit: Callus, Crepitus, Excoriation, Fluctuance, Friable, Induration, Localized Edema, Rash, Scarring, Dry/Scaly, Maceration, Moist, Atrophie Blanche, Cyanosis, Ecchymosis, Hemosiderin  Staining, Mottled, Pallor, Rubor, Erythema. EVAMARIA, DETORE ([id number]) Assessment Active Problems ICD-10 [phone] - Non-pressure chronic ulcer of other part of right foot with fat layer exposed M21.611 - Bunion of right foot F17.218 - Nicotine dependence, cigarettes, with other nicotine-induced disorders After debriding this wound sharply I have recommended Iodosorb and packing with 1/4 inch gauze strip. we will also put a piece of felt to offload it and she's got off loading shoes. I have recommended an x-ray of this foot to make sure it does not probe down to bone and there is any doubt of osteomyelitis. I have urged her to get an appointment with the foot and ankle surgeon to show him this as he may consider some corrective measures. We have spent some time discussing her nicotine addiction and have strongly counseled her to give up smoking completely. Respiratory benefits alternatives and all the possible complications of smoking have been discussed with her at length. All questions answered and she will come back and see me next week. Procedures Wound #1 Wound #1 is a Pressure Ulcer located on the Right,Medial Metatarsal head first . There was a Skin/Subcutaneous Tissue Debridement ([id number]) debridement with total area of 0.08 sq cm performed by Christin Fudge, MD. with the following instrument(s): Forceps and Scissors to remove Viable and Non-Viable tissue/material including Exudate, Fibrin/Slough, Skin, and Subcutaneous after achieving pain control using Other (lidocaine 4%). A time out was conducted prior to the start of the procedure. A Minimum amount of bleeding was controlled with Pressure. The procedure was tolerated well with a pain level of 1 throughout and a pain level of 2 following the procedure. Post Debridement Measurements: 0.6cm length x 0.6cm width x 0.3cm depth; 0.085cm^3 volume. Post debridement Stage noted as Category/Stage II. Post procedure Diagnosis Wound  #1: Same as Pre-Procedure Lecuyer, Jumana L. ([id number]) Plan Wound Cleansing: Wound #1 Right,Medial Metatarsal head first: Clean wound with Normal Saline. May Shower, gently pat wound dry prior to applying new dressing. Anesthetic: Wound #1 Right,Medial Metatarsal head first: Topical Lidocaine 4% cream applied to wound bed prior to debridement Primary Wound Dressing: Wound #1 Right,Medial Metatarsal head first: Iodoform packing Gauze Secondary Dressing: Wound #1 Right,Medial Metatarsal head first: Boardered Foam Dressing Dressing Change Frequency: Wound #1 Right,Medial Metatarsal head first: Change dressing every day. Follow-up Appointments: Wound #1 Right,Medial Metatarsal head first: Return Appointment in 1 week. Radiology ordered were: X-ray, foot - right first met head After debriding this wound sharply I have recommended Iodosorb and packing with 1/4 inch gauze strip. we will also put a piece of felt to offload it and she's got off loading shoes. I have recommended an x-ray of this foot to make sure it does not probe down to bone and there is any doubt of osteomyelitis. I have urged her to get an appointment with the foot and ankle surgeon to show  him this as he may consider some corrective measures. We have spent some time discussing her nicotine addiction and have strongly counseled her to give up smoking completely. Respiratory benefits alternatives and all the possible complications of smoking have been discussed with her at length. All questions answered and she will come back and see me next week. Electronic Signature(s) MARLIYAH, REID ([id number]) Signed: 02/25/2015 4:37:53 PM By: Christin Fudge MD, FACS Entered By: Christin Fudge on 02/25/2015 16:37:52 [name] [name] ([id number]) -------------------------------------------------------------------------------- SuperBill Details Patient Name: [name] [name] Date of Service: 02/25/2015 Medical Record Number:  [id number] Patient Account Number: [id number] Date of Birth/Sex: 01/17/1961 (54 y.o. Female) Treating RN: Cornell Barman Primary Care Physician: Eulogio Bear Other Clinician: Referring Physician: Eulogio Bear Treating Physician/Extender: Frann Rider in Treatment: 1 Diagnosis Coding ICD-10 Codes Code Description [phone] Non-pressure chronic ulcer of other part of right foot with fat layer exposed M21.611 Bunion of right foot F17.218 Nicotine dependence, cigarettes, with other nicotine-induced disorders Facility Procedures CPT4 Code Description: [id number] 11042 - DEB SUBQ TISSUE 20 SQ CM/< ICD-10 Description Diagnosis L97.512 Non-pressure chronic ulcer of other part of right fo M21.611 Bunion of right foot F17.218 Nicotine dependence, cigarettes, with other nicotine Modifier: ot with fat la -induced disor Quantity: 1 yer exposed ders Physician Procedures CPT4 Code Description: [id number] 11042 - WC PHYS SUBQ TISS 20 SQ CM ICD-10 Description Diagnosis L97.512 Non-pressure chronic ulcer of other part of right fo M21.611 Bunion of right foot F17.218 Nicotine dependence, cigarettes, with other nicotine Modifier: ot with fat lay -induced disord Quantity: 1 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

## 2015-02-26 NOTE — Progress Notes (Signed)
[name] [name], [name] [name] ([id number]) Visit Report for 02/25/2015 Arrival Information Details Patient Name: [name] [name], [name] [name] Date of Service: 02/25/2015 3:45 PM Medical Record Number: [id number] Patient Account Number: [id number] Date of Birth/Sex: Feb 10, 1961 (54 y.o. Female) Treating RN: Huel Coventry Primary Care Physician: Doristine Mango Other Clinician: Referring Physician: WHITE, Lanora Manis Treating Physician/Extender: Rudene Re in Treatment: 1 Visit Information History Since Last Visit Added or deleted any medications: No Patient Arrived: Ambulatory Any new allergies or adverse reactions: No Arrival Time: 15:56 Had a fall or experienced change in No Accompanied By: self activities of daily living that may affect Transfer Assistance: None risk of falls: Patient Identification Yes Signs or symptoms of abuse/neglect No Verified: since last visito Patient Has Alerts: Yes Hospitalized since last visit: No Patient Alerts: Patient on Blood Has Dressing in Place as Prescribed: Yes Thinner Has Footwear/Offloading in Place as Yes NOT Daibetic Prescribed: Right: Surgical Shoe with Pressure Relief Insole Pain Present Now: No Electronic Signature(s) Signed: 02/25/2015 6:09:37 PM By: Elliot Gurney, RN, BSN, Kim RN, BSN Entered By: Elliot Gurney, RN, BSN, Kim on 02/25/2015 15:57:29 [name] [name], [name] [name] ([id number]) -------------------------------------------------------------------------------- Encounter Discharge Information Details Patient Name: [name] [name] Date of Service: 02/25/2015 3:45 PM Medical Record Number: [id number] Patient Account Number: [id number] Date of Birth/Sex: 1961/04/04 (53 y.o. Female) Treating RN: Huel Coventry Primary Care Physician: Doristine Mango Other Clinician: Referring Physician: Doristine Mango Treating Physician/Extender: Rudene Re in Treatment: 1 Encounter Discharge Information Items Discharge Pain Level: 0 Discharge Condition:  Stable Ambulatory Status: Ambulatory Discharge Destination: Home Transportation: Private Auto Accompanied By: self Schedule Follow-up Appointment: Yes Medication Reconciliation completed and provided to Patient/Care Yes [name] [name]: Provided on Clinical Summary of Care: 02/25/2015 Form Type Recipient Paper Patient CE Electronic Signature(s) Signed: 02/25/2015 4:29:43 PM By: Gwenlyn Perking Entered By: Gwenlyn Perking on 02/25/2015 16:29:43 [name] [name] ([id number]) -------------------------------------------------------------------------------- Lower Extremity Assessment Details Patient Name: [name] [name] Date of Service: 02/25/2015 3:45 PM Medical Record Number: [id number] Patient Account Number: [id number] Date of Birth/Sex: 24-Sep-1960 (53 y.o. Female) Treating RN: Huel Coventry Primary Care Physician: Doristine Mango Other Clinician: Referring Physician: WHITE, Lanora Manis Treating Physician/Extender: Rudene Re in Treatment: 1 Vascular Assessment Pulses: Posterior Tibial Dorsalis Pedis Palpable: [Right:Yes] Extremity colors, hair growth, and conditions: Extremity Color: [Right:Normal] Hair Growth on Extremity: [Right:Yes] Temperature of Extremity: [Right:Warm] Capillary Refill: [Right:< 3 seconds] Toe Nail Assessment Left: Right: Thick: No Discolored: No Deformed: No Improper Length and Hygiene: No Electronic Signature(s) Signed: 02/25/2015 6:09:37 PM By: Elliot Gurney, RN, BSN, Kim RN, BSN Entered By: Elliot Gurney, RN, BSN, Kim on 02/25/2015 15:58:44 [name] [name], [name] [name] ([id number]) -------------------------------------------------------------------------------- Multi Wound Chart Details Patient Name: [name] [name] Date of Service: 02/25/2015 3:45 PM Medical Record Number: [id number] Patient Account Number: [id number] Date of Birth/Sex: 12-13-1960 (53 y.o. Female) Treating RN: Huel Coventry Primary Care Physician: Doristine Mango Other Clinician: Referring  Physician: WHITE, Lanora Manis Treating Physician/Extender: Rudene Re in Treatment: 1 Vital Signs Height(in): 67 Pulse(bpm): 84 Weight(lbs): 198 Blood Pressure 104/67 (mmHg): Body Mass Index(BMI): 31 Temperature(F): 98.6 Respiratory Rate 18 (breaths/min): Photos: [1:No Photos] [N/A:N/A] Wound Location: [1:Right Metatarsal head first N/A - Medial] Wounding Event: [1:Blister] [N/A:N/A] Primary Etiology: [1:Pressure Ulcer] [N/A:N/A] Comorbid History: [1:Hypertension, Osteoarthritis] [N/A:N/A] Date Acquired: [1:01/14/2015] [N/A:N/A] Weeks of Treatment: [1:1] [N/A:N/A] Wound Status: [1:Open] [N/A:N/A] Measurements L x W x D 0.4x0.2x0.4 [N/A:N/A] (cm) Area (cm) : [1:0.063] [N/A:N/A] Volume (cm) : [1:0.025] [N/A:N/A] % Reduction in Area: [1:73.30%] [N/A:N/A] % Reduction in Volume: 46.80% [N/A:N/A] Classification: [1:Category/Stage II] [N/A:N/A] Exudate Amount: [1:None Present] [N/A:N/A] Wound  Margin: [1:Indistinct, nonvisible] [N/A:N/A] Granulation Amount: [1:None Present (0%)] [N/A:N/A] Necrotic Amount: [1:Small (1-33%)] [N/A:N/A] Exposed Structures: [1:Fascia: No Fat: No Tendon: No Muscle: No Joint: No Bone: No Limited to Skin Breakdown] [N/A:N/A] Periwound Skin Texture: [N/A:N/A] Edema: No Excoriation: No Induration: No Callus: No Crepitus: No Fluctuance: No Friable: No Rash: No Scarring: No Periwound Skin Maceration: No N/A N/A Moisture: Moist: No Dry/Scaly: No Periwound Skin Color: Atrophie Blanche: No N/A N/A Cyanosis: No Ecchymosis: No Erythema: No Hemosiderin Staining: No Mottled: No Pallor: No Rubor: No Tenderness on No N/A N/A Palpation: Wound Preparation: Ulcer Cleansing: N/A N/A Rinsed/Irrigated with Saline Topical Anesthetic Applied: Other: liodocaine 4% Treatment Notes Electronic Signature(s) Signed: 02/25/2015 6:09:37 PM By: Elliot GurneyWoody, RN, BSN, Kim RN, BSN Entered By: Elliot GurneyWoody, RN, BSN, Kim on 02/25/2015 16:06:24 [name] [name], [name] L.  ([id number]) -------------------------------------------------------------------------------- Multi-Disciplinary Care Plan Details Patient Name: [name] [name], [name] L. Date of Service: 02/25/2015 3:45 PM Medical Record Number: [id number] Patient Account Number: [id number] Date of Birth/Sex: 11/16/1960 (53 y.o. Female) Treating RN: Huel CoventryWoody, Kim Primary Care Physician: Doristine MangoWHITE, ELIZABETH Other Clinician: Referring Physician: WHITE, Lanora ManisELIZABETH Treating Physician/Extender: Rudene ReBritto, Errol Weeks in Treatment: 1 Active Inactive Orientation to the Wound Care Program Nursing Diagnoses: Knowledge deficit related to the wound healing center program Goals: Patient/caregiver will verbalize understanding of the Wound Healing Center Program Date Initiated: 02/18/2015 Goal Status: Active Interventions: Provide education on orientation to the wound center Notes: Pressure Nursing Diagnoses: Knowledge deficit related to management of pressures ulcers Potential for impaired tissue integrity related to pressure, friction, moisture, and shear Goals: Patient will remain free of pressure ulcers Date Initiated: 02/18/2015 Goal Status: Active Interventions: Assess potential for pressure ulcer upon admission and as needed Notes: Wound/Skin Impairment Nursing Diagnoses: Knowledge deficit related to ulceration/compromised skin integrity Goals: Ulcer/skin breakdown will heal within 14 weeks [name] [name], [name] L. ([id number]) Date Initiated: 02/18/2015 Goal Status: Active Interventions: Assess patient/caregiver ability to perform ulcer/skin care regimen upon admission and as needed Treatment Activities: Topical wound management initiated : 02/25/2015 Notes: Electronic Signature(s) Signed: 02/25/2015 6:09:37 PM By: Elliot GurneyWoody, RN, BSN, Kim RN, BSN Entered By: Elliot GurneyWoody, RN, BSN, Kim on 02/25/2015 16:06:17 [name] [name], [name] L. ([id number]) -------------------------------------------------------------------------------- Pain  Assessment Details Patient Name: [name] [name], Mashawn L. Date of Service: 02/25/2015 3:45 PM Medical Record Number: [id number] Patient Account Number: [id number] Date of Birth/Sex: 10/27/1960 (53 y.o. Female) Treating RN: Huel CoventryWoody, Kim Primary Care Physician: Doristine MangoWHITE, ELIZABETH Other Clinician: Referring Physician: Doristine MangoWHITE, ELIZABETH Treating Physician/Extender: Rudene ReBritto, Errol Weeks in Treatment: 1 Active Problems Location of Pain Severity and Description of Pain Patient Has Paino No Site Locations Pain Management and Medication Current Pain Management: Electronic Signature(s) Signed: 02/25/2015 6:09:37 PM By: Elliot GurneyWoody, RN, BSN, Kim RN, BSN Entered By: Elliot GurneyWoody, RN, BSN, Kim on 02/25/2015 15:57:35 [name] [name], Elbony L. ([id number]) -------------------------------------------------------------------------------- Patient/Caregiver Education Details Patient Name: [name] [name], Yolonda L. Date of Service: 02/25/2015 3:45 PM Medical Record Number: [id number] Patient Account Number: [id number] Date of Birth/Gender: 01/23/1961 (53 y.o. Female) Treating RN: Huel CoventryWoody, Kim Primary Care Physician: Doristine MangoWHITE, ELIZABETH Other Clinician: Referring Physician: Doristine MangoWHITE, ELIZABETH Treating Physician/Extender: Rudene ReBritto, Errol Weeks in Treatment: 1 Education Assessment Education Provided To: Patient Education Topics Provided Wound/Skin Impairment: Handouts: Caring for Your Ulcer, Other: continuwe wound care as prescribed Electronic Signature(s) Signed: 02/25/2015 6:09:37 PM By: Elliot GurneyWoody, RN, BSN, Kim RN, BSN Entered By: Elliot GurneyWoody, RN, BSN, Kim on 02/25/2015 16:19:58 [name] [name], Sadi L. ([id number]) -------------------------------------------------------------------------------- Wound Assessment Details Patient Name: [name] [name], Timarie L. Date of Service: 02/25/2015 3:45 PM Medical Record Number: [id number] Patient Account Number: [id number] Date of Birth/Sex: 08/30/1960 (53 y.o.  Female) Treating RN: Huel Coventry Primary Care Physician: Doristine Mango Other Clinician: Referring Physician: WHITE, Lanora Manis Treating Physician/Extender: Rudene Re in Treatment: 1 Wound Status Wound Number: 1 Primary Etiology: Pressure Ulcer Wound Location: Right Metatarsal head first - Wound Status: Open Medial Comorbid History: Hypertension, Osteoarthritis Wounding Event: Blister Date Acquired: 01/14/2015 Weeks Of Treatment: 1 Clustered Wound: No Wound Measurements Length: (cm) 0.4 Width: (cm) 0.2 Depth: (cm) 0.4 Area: (cm) 0.063 Volume: (cm) 0.025 % Reduction in Area: 73.3% % Reduction in Volume: 46.8% Wound Description Classification: Category/Stage II Wound Margin: Indistinct, nonvisible Exudate Amount: None Present Wound Bed Granulation Amount: None Present (0%) Exposed Structure Necrotic Amount: Small (1-33%) Fascia Exposed: No Necrotic Quality: Adherent Slough Fat Layer Exposed: No Tendon Exposed: No Muscle Exposed: No Joint Exposed: No Bone Exposed: No Limited to Skin Breakdown Periwound Skin Texture Texture Color No Abnormalities Noted: No No Abnormalities Noted: No Callus: No Atrophie Blanche: No Crepitus: No Cyanosis: No Excoriation: No Ecchymosis: No Fluctuance: No Erythema: No Friable: No Hemosiderin Staining: No Tri, Brittie L. ([id number]) Induration: No Mottled: No Localized Edema: No Pallor: No Rash: No Rubor: No Scarring: No Moisture No Abnormalities Noted: No Dry / Scaly: No Maceration: No Moist: No Wound Preparation Ulcer Cleansing: Rinsed/Irrigated with Saline Topical Anesthetic Applied: Other: liodocaine 4%, Treatment Notes Wound #1 (Right, Medial Metatarsal head first) 1. Cleansed with: Clean wound with Normal Saline 2. Anesthetic Topical Lidocaine 4% cream to wound bed prior to debridement 4. Dressing Applied: Iodoform packing Gauze 5. Secondary Dressing Applied Kerlix/Conform 7. Secured with Tape Notes felt surrounding wound Electronic  Signature(s) Signed: 02/25/2015 6:09:37 PM By: Elliot Gurney, RN, BSN, Kim RN, BSN Entered By: Elliot Gurney, RN, BSN, Kim on 02/25/2015 16:03:11 [name] [name] ([id number]) -------------------------------------------------------------------------------- Vitals Details Patient Name: [name] [name] Date of Service: 02/25/2015 3:45 PM Medical Record Number: [id number] Patient Account Number: [id number] Date of Birth/Sex: 04-02-1961 (53 y.o. Female) Treating RN: Huel Coventry Primary Care Physician: Doristine Mango Other Clinician: Referring Physician: WHITE, Lanora Manis Treating Physician/Extender: Rudene Re in Treatment: 1 Vital Signs Time Taken: 13:57 Temperature (F): 98.6 Height (in): 67 Pulse (bpm): 84 Weight (lbs): 198 Respiratory Rate (breaths/min): 18 Body Mass Index (BMI): 31 Blood Pressure (mmHg): 104/67 Reference Range: 80 - 120 mg / dl Electronic Signature(s) Signed: 02/25/2015 6:09:37 PM By: Elliot Gurney, RN, BSN, Kim RN, BSN Entered By: Elliot Gurney, RN, BSN, Kim on 02/25/2015 15:57:58

## 2015-02-28 ENCOUNTER — Ambulatory Visit
Admission: RE | Admit: 2015-02-28 | Discharge: 2015-02-28 | Disposition: A | Discharge status: Home / Self Care | Payer: BLUE CROSS/BLUE SHIELD | Source: Intra-hospital | Attending: Surgery | Admitting: Surgery

## 2015-02-28 ENCOUNTER — Ambulatory Visit
Admission: RE | Admit: 2015-02-28 | Discharge: 2015-02-28 | Disposition: A | Discharge status: Home / Self Care | Payer: BLUE CROSS/BLUE SHIELD | Source: Ambulatory Visit | Attending: Surgery | Admitting: Surgery

## 2015-02-28 ENCOUNTER — Other Ambulatory Visit: Payer: Self-pay | Admitting: Surgery

## 2015-02-28 DIAGNOSIS — S81801A Unspecified open wound, right lower leg, initial encounter: Secondary | ICD-10-CM

## 2015-02-28 DIAGNOSIS — S91101A Unspecified open wound of right great toe without damage to nail, initial encounter: Secondary | ICD-10-CM | POA: Diagnosis present

## 2015-02-28 DIAGNOSIS — X58XXXA Exposure to other specified factors, initial encounter: Secondary | ICD-10-CM | POA: Diagnosis not present

## 2015-03-04 ENCOUNTER — Encounter: Payer: BLUE CROSS/BLUE SHIELD | Admitting: Surgery

## 2015-03-04 DIAGNOSIS — S91101S Unspecified open wound of right great toe without damage to nail, sequela: Secondary | ICD-10-CM | POA: Diagnosis not present

## 2015-03-04 NOTE — Progress Notes (Addendum)
JULLIE, [facility] ([id number]) Visit Report for 03/04/2015 Chief Complaint Document Details Patient Name: [name] [name], [name] [name] 03/04/2015 3:30 Date of Service: PM Medical Record [id number] Number: Patient Account Number: [id number] 02/27/1961 (54 y.o. Treating RN: Huel Coventry Date of Birth/Sex: Female) Other Clinician: Primary Care Physician: WHITE, ELIZABETH Treating Evlyn Kanner Referring Physician: Doristine Mango Physician/Extender: Weeks in Treatment: 2 Information Obtained from: Patient Chief Complaint Patient presents to the wound care center for a consult due non healing wound She's had a large callus on the medial part of her right forefoot and this has been there for over a month. Electronic Signature(s) Signed: 03/04/2015 3:59:05 PM By: Evlyn Kanner MD, FACS Entered By: Evlyn Kanner on 03/04/2015 15:59:05 [name] [name] ([id number]) -------------------------------------------------------------------------------- Debridement Details Patient Name: [name] [name] 03/04/2015 3:30 Date of Service: PM Medical Record [id number] Number: Patient Account Number: [id number] Jul 03, 1960 (53 y.o. Treating RN: Huel Coventry Date of Birth/Sex: Female) Other Clinician: Primary Care Physician: WHITE, ELIZABETH Treating Jakayden Cancio Referring Physician: WHITE, Lanora Manis Physician/Extender: Weeks in Treatment: 2 Debridement Performed for Wound #1 Right,Medial Metatarsal head first Assessment: Performed By: Physician Evlyn Kanner, MD Debridement: Debridement Pre-procedure Yes Verification/Time Out Taken: Start Time: 15:47 Pain Control: Other : lidocaine 4% Level: Skin/Subcutaneous Tissue Total Area Debrided (L x 0.3 (cm) x 0.4 (cm) = 0.12 (cm) W): Tissue and other Viable, Non-Viable, Callus, Exudate, Fibrin/Slough, Subcutaneous material debrided: Instrument: Forceps, Scissors Bleeding: Minimum Hemostasis Achieved: Pressure End Time: 15:55 Procedural Pain: 1 Post  Procedural Pain: 1 Response to Treatment: Procedure was tolerated well Post Debridement Measurements of Total Wound Length: (cm) 0.3 Stage: Category/Stage II Width: (cm) 0.4 Depth: (cm) 0.1 Volume: (cm) 0.009 Post Procedure Diagnosis Same as Pre-procedure Electronic Signature(s) Signed: 03/04/2015 3:58:59 PM By: Evlyn Kanner MD, FACS Signed: 03/04/2015 5:43:08 PM By: Elliot Gurney, RN, BSN, Kim RN, BSN Entered By: Evlyn Kanner on 03/04/2015 15:58:58 Raimondi, Lindon Romp ([id number]) -------------------------------------------------------------------------------- HPI Details Patient Name: [name] [name] 03/04/2015 3:30 Date of Service: PM Medical Record [id number] Number: Patient Account Number: [id number] May 08, 1961 (53 y.o. Treating RN: Huel Coventry Date of Birth/Sex: Female) Other Clinician: Primary Care Physician: WHITE, ELIZABETH Treating Evlyn Kanner Referring Physician: Doristine Mango Physician/Extender: Weeks in Treatment: 2 History of Present Illness Location: right forefoot medially near her bunion Quality: Patient reports experiencing a dull pain to affected area(s). Severity: Patient states wound are getting worse. Duration: Patient has had the wound for < 5 weeks prior to presenting for treatment Timing: Pain in wound is Intermittent (comes and goes Context: The wound occurred when the patient wore some ill fitting shoes Modifying Factors: she has had abnormality of her toes for a while and has seen her ankle and foot surgeon for the left side. Associated Signs and Symptoms: Patient reports presence of swelling HPI Description: 54 year old patient whose had abnormalities of both feet but her left foot was operated on by a foot and ankle surgeon and she had this in February of this year. He had taken some x-rays of the right foot but not address this deformity yet. She says about a month ago she was ill fitting shoes for a while and due to this she had a large  blister which then opened out and became an ulcer. She's also had some plantar warts on her right foot and has been applying some over-the-counter medication on this. She does not have diabetes mellitus or any other chronic problems but has been addicted to nicotine and has been smoking about half a pack of cigarettes for about 4  years. 03/04/2015 -- Right foot Xray examination shows no evidence of osteomyelitis. Arthropathic changes are seen at the [facility] and first metatarsal phalangeal joints. she has not yet gotten an appointment with her surgeon for both financial and social reasons. Electronic Signature(s) Signed: 03/04/2015 3:59:27 PM By: Evlyn Kanner MD, FACS Previous Signature: 03/04/2015 3:45:59 PM Version By: Evlyn Kanner MD, FACS Entered By: Evlyn Kanner on 03/04/2015 15:59:27 [name] [name] ([id number]) -------------------------------------------------------------------------------- Physical Exam Details Patient Name: [name] [name] 03/04/2015 3:30 Date of Service: PM Medical Record [id number] Number: Patient Account Number: [id number] Apr 24, 1961 (53 y.o. Treating RN: Huel Coventry Date of Birth/Sex: Female) Other Clinician: Primary Care Physician: WHITE, ELIZABETH Treating Evlyn Kanner Referring Physician: WHITE, Lanora Manis Physician/Extender: Weeks in Treatment: 2 Constitutional . Pulse regular. Respirations normal and unlabored. Afebrile. . Eyes Nonicteric. Reactive to light. Ears, Nose, Mouth, and Throat Lips, teeth, and gums WNL.Marland Kitchen Moist mucosa without lesions . Neck supple and nontender. No palpable supraclavicular or cervical adenopathy. Normal sized without goiter. Respiratory WNL. No retractions.. Cardiovascular Pedal Pulses WNL. No clubbing, cyanosis or edema. Lymphatic No adneopathy. No adenopathy. No adenopathy. Musculoskeletal Adexa without tenderness or enlargement.. Digits and nails w/o clubbing, cyanosis, infection, petechiae, ischemia, or  inflammatory conditions.. Integumentary (Hair, Skin) No suspicious lesions. No crepitus or fluctuance. No peri-wound warmth or erythema. No masses.Marland Kitchen Psychiatric Judgement and insight Intact.. No evidence of depression, anxiety, or agitation.. Notes there is still a overhanging edge and some callus and slough in the subcutaneous region of this wound which will need sharp debridement. Electronic Signature(s) Signed: 03/04/2015 4:00:10 PM By: Evlyn Kanner MD, FACS Entered By: Evlyn Kanner on 03/04/2015 16:00:10 [name] [name] ([id number]) -------------------------------------------------------------------------------- Physician Orders Details Patient Name: ALONDA, WEABER 03/04/2015 3:30 Date of Service: PM Medical Record [id number] Number: Patient Account Number: [id number] Jun 23, 1960 (53 y.o. Treating RN: Huel Coventry Date of Birth/Sex: Female) Other Clinician: Primary Care Physician: WHITE, ELIZABETH Treating Evlyn Kanner Referring Physician: Doristine Mango Physician/Extender: Tania Ade in Treatment: 2 Verbal / Phone Orders: Yes Clinician: Huel Coventry Read Back and Verified: Yes Diagnosis Coding Wound Cleansing Wound #1 Right,Medial Metatarsal head first o Clean wound with Normal Saline. o May Shower, gently pat wound dry prior to applying new dressing. Anesthetic Wound #1 Right,Medial Metatarsal head first o Topical Lidocaine 4% cream applied to wound bed prior to debridement Primary Wound Dressing Wound #1 Right,Medial Metatarsal head first o Iodoflex Secondary Dressing Wound #1 Right,Medial Metatarsal head first o Gauze and Kerlix/Conform - Felt surrounding wound for offloading Dressing Change Frequency Wound #1 Right,Medial Metatarsal head first o Change dressing every other day. Follow-up Appointments Wound #1 Right,Medial Metatarsal head first o Return Appointment in 1 week. Electronic Signature(s) Signed: 03/04/2015 4:31:36 PM By: Evlyn Kanner  MD, FACS Signed: 03/04/2015 5:43:08 PM By: Elliot Gurney RN, BSN, Kim RN, BSN Entered By: Elliot Gurney, RN, BSN, Kim on 03/04/2015 16:11:58 KENESHA, MOSHIER ([id number]) -------------------------------------------------------------------------------- Problem List Details Patient Name: TELICIA, HODGKISS 03/04/2015 3:30 Date of Service: PM Medical Record [id number] Number: Patient Account Number: [id number] 30-Mar-1961 (53 y.o. 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 Treating RN: Huel Coventry Date of Birth/Sex: Female) Other Clinician: Primary Care Physician: WHITE, ELIZABETH Treating Evlyn Kanner Referring Physician: Doristine Mango Physician/Extender: Weeks in Treatment: 2 Subjective Chief Complaint Information obtained from Patient Patient presents to the wound care center for a consult due non healing wound She's had a large callus on the medial part of her right forefoot and this has been there for over a month. History of Present Illness (HPI) The  following HPI elements were documented for the patient's wound: Location: right forefoot medially near her bunion Quality: Patient reports experiencing a dull pain to affected area(s). Severity: Patient states wound are getting worse. Duration: Patient has had the wound for < 5 weeks prior to presenting for treatment Timing: Pain in wound is Intermittent (comes and goes Context: The wound occurred when the patient wore some ill fitting shoes Modifying Factors: she has had abnormality of her toes for a while and has seen her ankle and foot surgeon for the left side. Associated Signs and Symptoms: Patient reports presence of swelling 54 year old patient whose had abnormalities of both feet but her left foot was operated on by a foot and ankle surgeon and she had this in February of this year. He had taken some x-rays of the right foot but not address this deformity yet. She says about a month ago she was ill fitting shoes for a while and due to this she had a large blister which then opened out and became an ulcer. She's also had some plantar warts on her right foot and has been applying some over-the-counter medication on this. She does not have diabetes mellitus or any other chronic problems but has been addicted to nicotine and has been smoking about half a pack of cigarettes for about 4 years. 03/04/2015 -- Right foot Xray examination shows no evidence of osteomyelitis. Arthropathic changes are seen at the [facility] and first metatarsal phalangeal joints. she has not yet gotten an appointment with her surgeon for both financial and social reasons. Guggenheim, Kaisey L. ([id number]) Objective Constitutional Pulse regular. Respirations normal and unlabored. Afebrile. Vitals Time Taken: 3:44 PM, Height: 67 in, Weight: 198 lbs, BMI: 31, Temperature: 98.7 F, Pulse: 89 bpm, Respiratory Rate: 18 breaths/min, Blood Pressure: 120/74 mmHg. Eyes Nonicteric. Reactive to light. Ears, Nose, Mouth, and  Throat Lips, teeth, and gums WNL.Marland Kitchen Moist mucosa without lesions . Neck supple and nontender. No palpable supraclavicular or cervical adenopathy. Normal sized without goiter. Respiratory WNL. No retractions.. Cardiovascular Pedal Pulses WNL. No clubbing, cyanosis or edema. Lymphatic No adneopathy. No adenopathy. No adenopathy. Musculoskeletal Adexa without tenderness or enlargement.. Digits and nails w/o clubbing, cyanosis, infection, petechiae, ischemia, or inflammatory conditions.Marland Kitchen Psychiatric Judgement and insight Intact.. No evidence of depression, anxiety, or agitation.. General Notes: there is still a overhanging edge and some callus and slough in the subcutaneous region of this wound which will need sharp debridement. Integumentary (Hair, Skin) No suspicious lesions. No crepitus or fluctuance. No peri-wound warmth or erythema. No masses.. Wound #1 status is Open. Original cause of wound was Blister. The wound is located on the Right,Medial Metatarsal head first. The wound measures 0.3cm length x 0.3cm width x 0.1cm depth; 0.071cm^2 area and 0.007cm^3 volume. The wound is limited to skin breakdown. There is no tunneling or undermining  noted. There is a none present amount of drainage noted. The wound margin is indistinct and nonvisible. There is large (67-100%) granulation within the wound bed. There is a small (1-33%) amount of necrotic tissue within the wound bed including Adherent Slough. The periwound skin appearance did not exhibit: Callus, Crepitus, Excoriation, Fluctuance, Friable, Induration, Localized Edema, Rash, Scarring, Dry/Scaly, Maceration, Moist, Atrophie Blanche, Cyanosis, Ecchymosis, Hemosiderin Staining, Mottled, Pallor, Rubor, Murtaugh, Drena L. ([id number]) Erythema. Assessment Active Problems ICD-10 L97.512 - Non-pressure chronic ulcer of other part of right foot with fat layer exposed M21.611 - Bunion of right foot F17.218 - Nicotine dependence, cigarettes,  with other nicotine-induced disorders Procedures Wound #1 Wound #1 is a Pressure Ulcer located on the Right,Medial Metatarsal head first . There was a Skin/Subcutaneous Tissue Debridement ([id number]([id number]) debridement with total area of 0.12 sq cm performed by Evlyn KannerBritto, Tarig Zimmers, MD. with the following instrument(s): Forceps and Scissors to remove Viable and Non-Viable tissue/material including Exudate, Fibrin/Slough, Callus, and Subcutaneous after achieving pain control using Other (lidocaine 4%). A time out was conducted prior to the start of the procedure. A Minimum amount of bleeding was controlled with Pressure. The procedure was tolerated well with a pain level of 1 throughout and a pain level of 1 following the procedure. Post Debridement Measurements: 0.3cm length x 0.4cm width x 0.1cm depth; 0.009cm^3 volume. Post debridement Stage noted as Category/Stage II. Post procedure Diagnosis Wound #1: Same as Pre-Procedure Plan Wound Cleansing: Wound #1 Right,Medial Metatarsal head first: Clean wound with Normal Saline. May Shower, gently pat wound dry prior to applying new dressing. Anesthetic: Wound #1 Right,Medial Metatarsal head first: Topical Lidocaine 4% cream applied to wound bed prior to debridement Primary Wound Dressing: Wound #1 Right,Medial Metatarsal head first: Iodoflex Geigle, Quiara L. ([id number]) Secondary Dressing: Wound #1 Right,Medial Metatarsal head first: Gauze and Kerlix/Conform - Felt surrounding wound for offloading Dressing Change Frequency: Wound #1 Right,Medial Metatarsal head first: Change dressing every other day. Follow-up Appointments: Wound #1 Right,Medial Metatarsal head first: Return Appointment in 1 week. After sharply debriding this wound I have recommended Iodosorb to be placed on this area with a small packing strip if possible. I have again reiterated my advice to see her surgeon as this may need corrective surgery before this gets to be worse and  osteomyelitis is a distinct possibility. She understands the treatment plan and will be back to see me next week. Electronic Signature(s) Signed: 03/05/2015 4:30:10 PM By: Evlyn KannerBritto, Duval Macleod MD, FACS Previous Signature: 03/04/2015 4:01:33 PM Version By: Evlyn KannerBritto, Jenia Klepper MD, FACS Entered By: Evlyn KannerBritto, Sargent Mankey on 03/05/2015 16:30:10 [name] BottsEBERLIN, Yuridia L. ([id number]) -------------------------------------------------------------------------------- SuperBill Details Patient Name: [name] BottsEBERLIN, Tanicka L. Date of Service: 03/04/2015 Medical Record Number: [id number] Patient Account Number: [id number] Date of Birth/Sex: 07/18/1960 (53 y.o. Female) Treating RN: Huel CoventryWoody, Kim Primary Care Physician: Doristine MangoWHITE, ELIZABETH Other Clinician: Referring Physician: Doristine MangoWHITE, ELIZABETH Treating Physician/Extender: Rudene ReBritto, Elpidia Karn Weeks in Treatment: 2 Diagnosis Coding ICD-10 Codes Code Description [phone] Non-pressure chronic ulcer of other part of right foot with fat layer exposed M21.611 Bunion of right foot F17.218 Nicotine dependence, cigarettes, with other nicotine-induced disorders Facility Procedures CPT4 Code Description: [id number] 11042 - DEB SUBQ TISSUE 20 SQ CM/< ICD-10 Description Diagnosis L97.512 Non-pressure chronic ulcer of other part of right fo M21.611 Bunion of right foot F17.218 Nicotine dependence, cigarettes, with other nicotine Modifier: ot with fat la -induced disor Quantity: 1 yer exposed ders Physician Procedures CPT4 Code Description: [id number] 11042 - WC PHYS SUBQ TISS 20 SQ CM ICD-10 Description Diagnosis L97.512 Non-pressure chronic ulcer  of other part of right fo M21.611 Bunion of right foot F17.218 Nicotine dependence, cigarettes, with other nicotine Modifier: ot with fat lay -induced disord Quantity: 1 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

## 2015-03-05 NOTE — Progress Notes (Signed)
ABILENE, MCPHEE ([id number]) Visit Report for 03/04/2015 Arrival Information Details Patient Name: [name] [name] Date of Service: 03/04/2015 3:30 PM Medical Record Number: [id number] Patient Account Number: [id number] Date of Birth/Sex: 18-Jun-1960 (54 y.o. Female) Treating RN: Huel Coventry Primary Care Physician: Doristine Mango Other Clinician: Referring Physician: WHITE, Lanora Manis Treating Physician/Extender: Rudene Re in Treatment: 2 Visit Information History Since Last Visit Added or deleted any medications: No Patient Arrived: Ambulatory Any new allergies or adverse reactions: No Arrival Time: 15:44 Had a fall or experienced change in No Accompanied By: self activities of daily living that may affect Transfer Assistance: None risk of falls: Patient Identification Verified: Yes Signs or symptoms of abuse/neglect No Secondary Verification Process Yes since last visito Completed: Hospitalized since last visit: No Patient Has Alerts: Yes Has Dressing in Place as Prescribed: Yes Patient Alerts: Patient on Blood Has Footwear/Offloading in Place as Yes Thinner Prescribed: NOT Daibetic Right: Surgical Shoe with Pressure Relief Insole Pain Present Now: No Electronic Signature(s) Signed: 03/04/2015 5:43:08 PM By: Elliot Gurney, RN, BSN, Kim RN, BSN Entered By: Elliot Gurney, RN, BSN, Kim on 03/04/2015 15:44:40 [name] [name] ([id number]) -------------------------------------------------------------------------------- Encounter Discharge Information Details Patient Name: [name] [name] Date of Service: 03/04/2015 3:30 PM Medical Record Number: [id number] Patient Account Number: [id number] Date of Birth/Sex: December 01, 1960 (53 y.o. Female) Treating RN: Huel Coventry Primary Care Physician: Doristine Mango Other Clinician: Referring Physician: Doristine Mango Treating Physician/Extender: Rudene Re in Treatment: 2 Encounter Discharge Information Items Discharge Pain  Level: 0 Discharge Condition: Stable Ambulatory Status: Ambulatory Discharge Destination: Home Transportation: Private Auto Accompanied By: self Schedule Follow-up Appointment: Yes Medication Reconciliation completed and provided to Patient/Care Yes Parris Cudworth: Provided on Clinical Summary of Care: 03/04/2015 Form Type Recipient Paper Patient CE Electronic Signature(s) Signed: 03/04/2015 5:43:08 PM By: Elliot Gurney RN, BSN, Kim RN, BSN Previous Signature: 03/04/2015 4:04:57 PM Version By: Gwenlyn Perking Entered By: Elliot Gurney RN, BSN, Kim on 03/04/2015 16:13:22 [name] [name] ([id number]) -------------------------------------------------------------------------------- Lower Extremity Assessment Details Patient Name: [name] [name] Date of Service: 03/04/2015 3:30 PM Medical Record Number: [id number] Patient Account Number: [id number] Date of Birth/Sex: 05-22-1960 (53 y.o. Female) Treating RN: Huel Coventry Primary Care Physician: Doristine Mango Other Clinician: Referring Physician: WHITE, Lanora Manis Treating Physician/Extender: Rudene Re in Treatment: 2 Vascular Assessment Pulses: Posterior Tibial Palpable: [Right:Yes] Dorsalis Pedis Palpable: [Right:Yes] Extremity colors, hair growth, and conditions: Extremity Color: [Right:Normal] Temperature of Extremity: [Right:Warm] Capillary Refill: [Right:< 3 seconds] Toe Nail Assessment Left: Right: Thick: No Discolored: No Deformed: No Improper Length and Hygiene: No Electronic Signature(s) Signed: 03/04/2015 5:43:08 PM By: Elliot Gurney, RN, BSN, Kim RN, BSN Entered By: Elliot Gurney, RN, BSN, Kim on 03/04/2015 15:45:59 [name] [name] ([id number]) -------------------------------------------------------------------------------- Multi Wound Chart Details Patient Name: [name] [name] Date of Service: 03/04/2015 3:30 PM Medical Record Number: [id number] Patient Account Number: [id number] Date of Birth/Sex: April 16, 1961 (53 y.o.  Female) Treating RN: Huel Coventry Primary Care Physician: Doristine Mango Other Clinician: Referring Physician: WHITE, Lanora Manis Treating Physician/Extender: Rudene Re in Treatment: 2 Vital Signs Height(in): 67 Pulse(bpm): 89 Weight(lbs): 198 Blood Pressure 120/74 (mmHg): Body Mass Index(BMI): 31 Temperature(F): 98.7 Respiratory Rate 18 (breaths/min): Photos: [1:No Photos] [N/A:N/A] Wound Location: [1:Right Metatarsal head first N/A - Medial] Wounding Event: [1:Blister] [N/A:N/A] Primary Etiology: [1:Pressure Ulcer] [N/A:N/A] Comorbid History: [1:Hypertension, Osteoarthritis] [N/A:N/A] Date Acquired: [1:01/14/2015] [N/A:N/A] Weeks of Treatment: [1:2] [N/A:N/A] Wound Status: [1:Open] [N/A:N/A] Measurements L x W x D 0.3x0.3x0.1 [N/A:N/A] (cm) Area (cm) : [1:0.071] [N/A:N/A] Volume (cm) : [1:0.007] [N/A:N/A] % Reduction in Area: [1:69.90%] [  N/A:N/A] % Reduction in Volume: 85.10% [N/A:N/A] Classification: [1:Category/Stage II] [N/A:N/A] Exudate Amount: [1:None Present] [N/A:N/A] Wound Margin: [1:Indistinct, nonvisible] [N/A:N/A] Granulation Amount: [1:Large (67-100%)] [N/A:N/A] Necrotic Amount: [1:Small (1-33%)] [N/A:N/A] Exposed Structures: [1:Fascia: No Fat: No Tendon: No Muscle: No Joint: No Bone: No Limited to Skin Breakdown] [N/A:N/A] Epithelialization: [1:Large (67-100%)] [N/A:N/A] Periwound Skin Texture: Edema: No N/A N/A Excoriation: No Induration: No Callus: No Crepitus: No Fluctuance: No Friable: No Rash: No Scarring: No Periwound Skin Maceration: No N/A N/A Moisture: Moist: No Dry/Scaly: No Periwound Skin Color: Atrophie Blanche: No N/A N/A Cyanosis: No Ecchymosis: No Erythema: No Hemosiderin Staining: No Mottled: No Pallor: No Rubor: No Tenderness on No N/A N/A Palpation: Wound Preparation: Ulcer Cleansing: N/A N/A Rinsed/Irrigated with Saline Topical Anesthetic Applied: Other: liodocaine 4% Treatment Notes Electronic  Signature(s) Signed: 03/04/2015 5:43:08 PM By: Elliot Gurney, RN, BSN, Kim RN, BSN Entered By: Elliot Gurney, RN, BSN, Kim on 03/04/2015 15:48:19 [name] [name] ([id number]) -------------------------------------------------------------------------------- Multi-Disciplinary Care Plan Details Patient Name: [name] [name] Date of Service: 03/04/2015 3:30 PM Medical Record Number: [id number] Patient Account Number: [id number] Date of Birth/Sex: Jun 26, 1960 (53 y.o. Female) Treating RN: Huel Coventry Primary Care Physician: Doristine Mango Other Clinician: Referring Physician: WHITE, Lanora Manis Treating Physician/Extender: Rudene Re in Treatment: 2 Active Inactive Orientation to the Wound Care Program Nursing Diagnoses: Knowledge deficit related to the wound healing center program Goals: Patient/caregiver will verbalize understanding of the Wound Healing Center Program Date Initiated: 02/18/2015 Goal Status: Active Interventions: Provide education on orientation to the wound center Notes: Pressure Nursing Diagnoses: Knowledge deficit related to management of pressures ulcers Potential for impaired tissue integrity related to pressure, friction, moisture, and shear Goals: Patient will remain free of pressure ulcers Date Initiated: 02/18/2015 Goal Status: Active Interventions: Assess potential for pressure ulcer upon admission and as needed Notes: Wound/Skin Impairment Nursing Diagnoses: Knowledge deficit related to ulceration/compromised skin integrity Goals: Ulcer/skin breakdown will heal within 14 weeks [name] [name] L. ([id number]) Date Initiated: 02/18/2015 Goal Status: Active Interventions: Assess patient/caregiver ability to perform ulcer/skin care regimen upon admission and as needed Treatment Activities: Topical wound management initiated : 03/04/2015 Notes: Electronic Signature(s) Signed: 03/04/2015 5:43:08 PM By: Elliot Gurney, RN, BSN, Kim RN, BSN Entered By: Elliot Gurney, RN,  BSN, Kim on 03/04/2015 15:48:13 [name] [name] [name] ([id number]) -------------------------------------------------------------------------------- Pain Assessment Details Patient Name: [name] [name] Date of Service: 03/04/2015 3:30 PM Medical Record Number: [id number] Patient Account Number: [id number] Date of Birth/Sex: March 19, 1961 (53 y.o. Female) Treating RN: Huel Coventry Primary Care Physician: Doristine Mango Other Clinician: Referring Physician: Doristine Mango Treating Physician/Extender: Rudene Re in Treatment: 2 Active Problems Location of Pain Severity and Description of Pain Patient Has Paino No Site Locations Pain Management and Medication Current Pain Management: Electronic Signature(s) Signed: 03/04/2015 5:43:08 PM By: Elliot Gurney, RN, BSN, Kim RN, BSN Entered By: Elliot Gurney, RN, BSN, Kim on 03/04/2015 15:44:48 [name] [name] ([id number]) -------------------------------------------------------------------------------- Patient/Caregiver Education Details Patient Name: [name] [name] Date of Service: 03/04/2015 3:30 PM Medical Record Number: [id number] Patient Account Number: [id number] Date of Birth/Gender: June 06, 1960 (53 y.o. Female) Treating RN: Huel Coventry Primary Care Physician: Doristine Mango Other Clinician: Referring Physician: Doristine Mango Treating Physician/Extender: Rudene Re in Treatment: 2 Education Assessment Education Provided To: Patient Education Topics Provided Wound/Skin Impairment: Handouts: Caring for Your Ulcer, Other: coutinue wound care as prescribed Methods: Demonstration, Explain/Verbal Responses: State content correctly Electronic Signature(s) Signed: 03/04/2015 5:43:08 PM By: Elliot Gurney, RN, BSN, Kim RN, BSN Entered By: Elliot Gurney, RN, BSN, Kim on 03/04/2015 16:13:47 [name] [name] [name] ([id number]) --------------------------------------------------------------------------------  Wound Assessment Details Patient Name: [name] BottsBERLIN,  [name] L. Date of Service: 03/04/2015 3:30 PM Medical Record Number: [id number] Patient Account Number: [id number] Date of Birth/Sex: 10/31/1960 (54 y.o. Female) Treating RN: Huel CoventryWoody, Kim Primary Care Physician: Doristine MangoWHITE, ELIZABETH Other Clinician: Referring Physician: WHITE, Lanora ManisELIZABETH Treating Physician/Extender: Rudene ReBritto, Errol Weeks in Treatment: 2 Wound Status Wound Number: 1 Primary Etiology: Pressure Ulcer Wound Location: Right Metatarsal head first - Wound Status: Open Medial Comorbid History: Hypertension, Osteoarthritis Wounding Event: Blister Date Acquired: 01/14/2015 Weeks Of Treatment: 2 Clustered Wound: No Photos Photo Uploaded By: Elliot GurneyWoody, RN, BSN, Kim on 03/04/2015 17:09:10 Wound Measurements Length: (cm) 0.3 Width: (cm) 0.3 Depth: (cm) 0.1 Area: (cm) 0.071 Volume: (cm) 0.007 % Reduction in Area: 69.9% % Reduction in Volume: 85.1% Epithelialization: Large (67-100%) Tunneling: No Undermining: No Wound Description Classification: Category/Stage II Wound Margin: Indistinct, nonvisible Exudate Amount: None Present Wound Bed Granulation Amount: Large (67-100%) Exposed Structure Necrotic Amount: Small (1-33%) Fascia Exposed: No Necrotic Quality: Adherent Slough Fat Layer Exposed: No Tendon Exposed: No Muscle Exposed: No [name] [name] L. ([id number]) Joint Exposed: No Bone Exposed: No Limited to Skin Breakdown Periwound Skin Texture Texture Color No Abnormalities Noted: No No Abnormalities Noted: No Callus: No Atrophie Blanche: No Crepitus: No Cyanosis: No Excoriation: No Ecchymosis: No Fluctuance: No Erythema: No Friable: No Hemosiderin Staining: No Induration: No Mottled: No Localized Edema: No Pallor: No Rash: No Rubor: No Scarring: No Moisture No Abnormalities Noted: No Dry / Scaly: No Maceration: No Moist: No Wound Preparation Ulcer Cleansing: Rinsed/Irrigated with Saline Topical Anesthetic Applied: Other: liodocaine 4%, Treatment  Notes Wound #1 (Right, Medial Metatarsal head first) 1. Cleansed with: Clean wound with Normal Saline 2. Anesthetic Topical Lidocaine 4% cream to wound bed prior to debridement 4. Dressing Applied: Iodoflex 5. Secondary Dressing Applied Gauze and Kerlix/Conform Notes felt surrounding wound Electronic Signature(s) Signed: 03/04/2015 5:43:08 PM By: Elliot GurneyWoody, RN, BSN, Kim RN, BSN Entered By: Elliot GurneyWoody, RN, BSN, Kim on 03/04/2015 15:47:41 [name] [name] RompATHY L. ([id number]) -------------------------------------------------------------------------------- Vitals Details Patient Name: [name] BottsEBERLIN, [name] L. Date of Service: 03/04/2015 3:30 PM Medical Record Number: [id number] Patient Account Number: [id number] Date of Birth/Sex: 04/28/1961 (53 y.o. Female) Treating RN: Huel CoventryWoody, Kim Primary Care Physician: Doristine MangoWHITE, ELIZABETH Other Clinician: Referring Physician: WHITE, Lanora ManisELIZABETH Treating Physician/Extender: Rudene ReBritto, Errol Weeks in Treatment: 2 Vital Signs Time Taken: 15:44 Temperature (F): 98.7 Height (in): 67 Pulse (bpm): 89 Weight (lbs): 198 Respiratory Rate (breaths/min): 18 Body Mass Index (BMI): 31 Blood Pressure (mmHg): 120/74 Reference Range: 80 - 120 mg / dl Electronic Signature(s) Signed: 03/04/2015 5:43:08 PM By: Elliot GurneyWoody, RN, BSN, Kim RN, BSN Entered By: Elliot GurneyWoody, RN, BSN, Kim on 03/04/2015 15:45:13

## 2015-03-12 ENCOUNTER — Ambulatory Visit: Payer: BLUE CROSS/BLUE SHIELD | Admitting: Surgery

## 2015-03-19 ENCOUNTER — Encounter: Payer: BLUE CROSS/BLUE SHIELD | Attending: Surgery | Admitting: Surgery

## 2015-03-19 DIAGNOSIS — F17218 Nicotine dependence, cigarettes, with other nicotine-induced disorders: Secondary | ICD-10-CM | POA: Insufficient documentation

## 2015-03-19 DIAGNOSIS — L97512 Non-pressure chronic ulcer of other part of right foot with fat layer exposed: Secondary | ICD-10-CM | POA: Insufficient documentation

## 2015-03-19 DIAGNOSIS — M21611 Bunion of right foot: Secondary | ICD-10-CM | POA: Diagnosis not present

## 2015-03-19 NOTE — Progress Notes (Addendum)
[name], [name] ([id number]) Visit Report for 03/19/2015 Arrival Information Details Patient Name: [name] [name], [name] [name] Date of Service: 03/19/2015 12:45 PM Medical Record Number: [id number] Patient Account Number: [id number] Date of Birth/Sex: 05-May-1961 (54 y.o. Female) Treating RN: Afful, RN, BSN, [place] Sink Primary Care Physician: Doristine Mango Other Clinician: Referring Physician: WHITE, Lanora Manis Treating Physician/Extender: Rudene Re in Treatment: 4 Visit Information History Since Last Visit Any new allergies or adverse reactions: No Patient Arrived: Ambulatory Had a fall or experienced change in No Arrival Time: 12:51 activities of daily living that may affect Accompanied By: self risk of falls: Transfer Assistance: None Signs or symptoms of abuse/neglect since last No Patient Identification Yes visito Verified: Hospitalized since last visit: No Patient Has Alerts: Yes Has Dressing in Place as Prescribed: Yes Patient Alerts: Patient on Blood Pain Present Now: No Thinner NOT Daibetic Electronic Signature(s) Signed: 03/19/2015 12:52:38 PM By: Elpidio Eric BSN, RN Entered By: Elpidio Eric on 03/19/2015 12:52:37 [name] [name] ([id number]) -------------------------------------------------------------------------------- Encounter Discharge Information Details Patient Name: [name] [name] Date of Service: 03/19/2015 12:45 PM Medical Record Number: [id number] Patient Account Number: [id number] Date of Birth/Sex: 02-Feb-1961 (54 y.o. Female) Treating RN: Clover Mealy, RN, BSN, [place] Sink Primary Care Physician: Doristine Mango Other Clinician: Referring Physician: Doristine Mango Treating Physician/Extender: Rudene Re in Treatment: 4 Encounter Discharge Information Items Schedule Follow-up Appointment: No Medication Reconciliation completed No and provided to Patient/Care Debhora Titus: Provided on Clinical Summary of Care: 03/19/2015 Form Type Recipient Paper  Patient CE Electronic Signature(s) Signed: 03/19/2015 1:13:38 PM By: Gwenlyn Perking Entered By: Gwenlyn Perking on 03/19/2015 13:13:38 [name] [name] ([id number]) -------------------------------------------------------------------------------- Lower Extremity Assessment Details Patient Name: [name] [name] Date of Service: 03/19/2015 12:45 PM Medical Record Number: [id number] Patient Account Number: [id number] Date of Birth/Sex: July 13, 1960 (54 y.o. Female) Treating RN: Afful, RN, BSN, [place] Sink Primary Care Physician: Doristine Mango Other Clinician: Referring Physician: WHITE, Lanora Manis Treating Physician/Extender: Rudene Re in Treatment: 4 Vascular Assessment Pulses: Posterior Tibial Dorsalis Pedis Palpable: [Right:Yes] Extremity colors, hair growth, and conditions: Extremity Color: [Right:Normal] Hair Growth on Extremity: [Right:No] Temperature of Extremity: [Right:Warm] Capillary Refill: [Right:< 3 seconds] Toe Nail Assessment Left: Right: Thick: No Discolored: No Deformed: No Improper Length and Hygiene: No Electronic Signature(s) Signed: 03/19/2015 12:54:05 PM By: Elpidio Eric BSN, RN Entered By: Elpidio Eric on 03/19/2015 12:54:05 [name] [name] ([id number]) -------------------------------------------------------------------------------- Multi Wound Chart Details Patient Name: [name] [name] Date of Service: 03/19/2015 12:45 PM Medical Record Number: [id number] Patient Account Number: [id number] Date of Birth/Sex: 01/10/1961 (54 y.o. Female) Treating RN: Clover Mealy, RN, BSN, [place] Sink Primary Care Physician: Doristine Mango Other Clinician: Referring Physician: WHITE, Lanora Manis Treating Physician/Extender: Rudene Re in Treatment: 4 Vital Signs Height(in): 67 Pulse(bpm): 94 Weight(lbs): 198 Blood Pressure 118/65 (mmHg): Body Mass Index(BMI): 31 Temperature(F): 98.3 Respiratory Rate 18 (breaths/min): Photos: [1:No Photos] [N/A:N/A] Wound  Location: [1:Right, Medial Metatarsal head first] [N/A:N/A] Wounding Event: [1:Blister] [N/A:N/A] Primary Etiology: [1:Pressure Ulcer] [N/A:N/A] Date Acquired: [1:01/14/2015] [N/A:N/A] Weeks of Treatment: [1:4] [N/A:N/A] Wound Status: [1:Open] [N/A:N/A] Measurements L x W x D 0.1x0.1x0.1 [N/A:N/A] (cm) Area (cm) : [1:0.008] [N/A:N/A] Volume (cm) : [1:0.001] [N/A:N/A] % Reduction in Area: [1:96.60%] [N/A:N/A] % Reduction in Volume: 97.90% [N/A:N/A] Classification: [1:Category/Stage II] [N/A:N/A] Periwound Skin Texture: No Abnormalities Noted [N/A:N/A] Periwound Skin [1:No Abnormalities Noted] [N/A:N/A] Moisture: Periwound Skin Color: No Abnormalities Noted [N/A:N/A] Tenderness on [1:No] [N/A:N/A] Treatment Notes Electronic Signature(s) Signed: 03/19/2015 1:04:46 PM By: Elpidio Eric BSN, RN Entered By: Elpidio Eric on 03/19/2015 13:04:46 [name] [name], [name] [name] ([id number]) [name] [name], [name] [name]  Elbert Ewings ([id number]) -------------------------------------------------------------------------------- Multi-Disciplinary Care Plan Details Patient Name: [name] [name], [name] [name] Date of Service: 03/19/2015 12:45 PM Medical Record Number: [id number] Patient Account Number: [id number] Date of Birth/Sex: 10-30-1960 (54 y.o. Female) Treating RN: Afful, RN, BSN, [place] Sink Primary Care Physician: Doristine Mango Other Clinician: Referring Physician: WHITE, Lanora Manis Treating Physician/Extender: Rudene Re in Treatment: 4 Active Inactive Orientation to the Wound Care Program Nursing Diagnoses: Knowledge deficit related to the wound healing center program Goals: Patient/caregiver will verbalize understanding of the Wound Healing Center Program Date Initiated: 02/18/2015 Goal Status: Active Interventions: Provide education on orientation to the wound center Notes: Pressure Nursing Diagnoses: Knowledge deficit related to management of pressures ulcers Potential for impaired tissue integrity related to pressure,  friction, moisture, and shear Goals: Patient will remain free of pressure ulcers Date Initiated: 02/18/2015 Goal Status: Active Interventions: Assess potential for pressure ulcer upon admission and as needed Notes: Wound/Skin Impairment Nursing Diagnoses: Knowledge deficit related to ulceration/compromised skin integrity Goals: Ulcer/skin breakdown will heal within 14 weeks [name] [name], [name] L. ([id number]) Date Initiated: 02/18/2015 Goal Status: Active Interventions: Assess patient/caregiver ability to perform ulcer/skin care regimen upon admission and as needed Treatment Activities: Topical wound management initiated : 03/19/2015 Notes: Electronic Signature(s) Signed: 03/19/2015 1:04:40 PM By: Elpidio Eric BSN, RN Entered By: Elpidio Eric on 03/19/2015 13:04:40 [name] [name] ([id number]) -------------------------------------------------------------------------------- Pain Assessment Details Patient Name: [name] [name] Date of Service: 03/19/2015 12:45 PM Medical Record Number: [id number] Patient Account Number: [id number] Date of Birth/Sex: 1961/01/28 (54 y.o. Female) Treating RN: Clover Mealy, RN, BSN, [place] Sink Primary Care Physician: Doristine Mango Other Clinician: Referring Physician: Doristine Mango Treating Physician/Extender: Rudene Re in Treatment: 4 Active Problems Location of Pain Severity and Description of Pain Patient Has Paino No Site Locations Pain Management and Medication Current Pain Management: Electronic Signature(s) Signed: 03/19/2015 12:52:44 PM By: Elpidio Eric BSN, RN Entered By: Elpidio Eric on 03/19/2015 12:52:44 [name] [name] ([id number]) -------------------------------------------------------------------------------- Patient/Caregiver Education Details Patient Name: [name] [name] Date of Service: 03/19/2015 12:45 PM Medical Record Number: [id number] Patient Account Number: [id number] Date of Birth/Gender: April 09, 1961 (54 y.o.  Female) Treating RN: Afful, RN, BSN,  Sink Primary Care Physician: Doristine Mango Other Clinician: Referring Physician: Doristine Mango Treating Physician/Extender: Rudene Re in Treatment: 4 Education Assessment Education Provided To: Patient Education Topics Provided Welcome To The Wound Care Center: Methods: Explain/Verbal Responses: State content correctly Electronic Signature(s) Signed: 03/19/2015 1:13:55 PM By: Elpidio Eric BSN, RN Entered By: Elpidio Eric on 03/19/2015 13:13:55 [name] [name] ([id number]) -------------------------------------------------------------------------------- Wound Assessment Details Patient Name: [name] [name] Date of Service: 03/19/2015 12:45 PM Medical Record Number: [id number] Patient Account Number: [id number] Date of Birth/Sex: May 14, 1960 (54 y.o. Female) Treating RN: Afful, RN, BSN, Psychologist, clinical Primary Care Physician: Doristine Mango Other Clinician: Referring Physician: WHITE, Lanora Manis Treating Physician/Extender: Rudene Re in Treatment: 4 Wound Status Wound Number: 1 Primary Etiology: Pressure Ulcer Wound Location: Right, Medial Metatarsal head Wound Status: Open first Wounding Event: Blister Date Acquired: 01/14/2015 Weeks Of Treatment: 4 Clustered Wound: No Photos Photo Uploaded By: Elpidio Eric on 03/19/2015 16:33:07 Wound Measurements Length: (cm) 0.1 Width: (cm) 0.1 Depth: (cm) 0.1 Area: (cm) 0.008 Volume: (cm) 0.001 % Reduction in Area: 96.6% % Reduction in Volume: 97.9% Wound Description Classification: Category/Stage II Periwound Skin Texture Texture Color No Abnormalities Noted: No No Abnormalities Noted: No Moisture No Abnormalities Noted: No Treatment Notes Wound #1 (Right, Medial Metatarsal head first) [name] [name], [name] L. ([id number]) 1. Cleansed with: Clean wound with Normal Saline 2. Anesthetic Topical Lidocaine 4% cream  to wound bed prior to debridement 4. Dressing  Applied: Iodoflex 5. Secondary Dressing Applied Gauze and Kerlix/Conform 6. Footwear/Offloading device applied Surgical shoe 7. Secured with Tape Notes felt surrounding wound Electronic Signature(s) Signed: 03/20/2015 4:32:27 PM By: Elpidio EricAfful, Rita BSN, RN Entered By: Elpidio EricAfful, Rita on 03/19/2015 12:58:49 [name] [name], [name] L. ([id number]) -------------------------------------------------------------------------------- Vitals Details Patient Name: [name] [name], [name] L. Date of Service: 03/19/2015 12:45 PM Medical Record Number: [id number] Patient Account Number: [id number] Date of Birth/Sex: 02/20/1961 (54 y.o. Female) Treating RN: Afful, RN, BSN,  [place] Primary Care Physician: Doristine MangoWHITE, ELIZABETH Other Clinician: Referring Physician: WHITE, Lanora ManisELIZABETH Treating Physician/Extender: Rudene ReBritto, Errol Weeks in Treatment: 4 Vital Signs Time Taken: 12:54 Temperature (F): 98.3 Height (in): 67 Pulse (bpm): 94 Weight (lbs): 198 Respiratory Rate (breaths/min): 18 Body Mass Index (BMI): 31 Blood Pressure (mmHg): 118/65 Reference Range: 80 - 120 mg / dl Electronic Signature(s) Signed: 03/19/2015 12:54:45 PM By: Elpidio EricAfful, Rita BSN, RN Entered By: Elpidio EricAfful, Rita on 03/19/2015 12:54:45

## 2015-03-21 NOTE — Progress Notes (Signed)
GIOVANNI, BATH ([id number]) Visit Report for 03/19/2015 Chief Complaint Document Details Patient Name: [name] [name], [name] [name] 03/19/2015 12:45 Date of Service: PM Medical Record [id number] Number: Patient Account Number: [id number] 01-31-1961 (54 y.o. Treating RN: Afful, RN, BSN, [place] Sink Date of Birth/Sex: Female) Other Clinician: Primary Care Physician: WHITE, ELIZABETH Treating [place] Referring Physician: WHITE, ELIZABETH Physician/Extender: Weeks in Treatment: 4 Information Obtained from: Patient Chief Complaint Patient presents to the wound care center for a consult due non healing wound She's had a large callus on the medial part of her right forefoot and this has been there for over a month. Electronic Signature(s) Signed: 03/19/2015 1:21:47 PM By: Evlyn Kanner MD, FACS Entered By: Evlyn Kanner on 03/19/2015 13:21:47 [name] [name] ([id number]) -------------------------------------------------------------------------------- Debridement Details Patient Name: [name] [name], [name] [name] 03/19/2015 12:45 Date of Service: PM Medical Record [id number] Number: Patient Account Number: [id number] 09-08-1960 (54 y.o. Treating RN: Clover Mealy, RN, BSN, [place] Sink Date of Birth/Sex: Female) Other Clinician: Primary Care Physician: WHITE, ELIZABETH Treating Asha Grumbine Referring Physician: WHITE, ELIZABETH Physician/Extender: Weeks in Treatment: 4 Debridement Performed for Wound #1 Right,Medial Metatarsal head first Assessment: Performed By: Physician Evlyn Kanner, MD Debridement: Open Wound/Selective Debridement Selective Description: Pre-procedure Yes Verification/Time Out Taken: Start Time: 13:03 Pain Control: Lidocaine 4% Topical Solution Level: Non-Viable Tissue Total Area Debrided (L x 0.1 (cm) x 0.1 (cm) = 0.01 (cm) W): Tissue and other Non-Viable, Eschar, Exudate, Fibrin/Slough, Skin, Subcutaneous material debrided: Instrument: Forceps Bleeding: Minimum Hemostasis Achieved:  Pressure End Time: 13:07 Procedural Pain: 0 Post Procedural Pain: 0 Response to Treatment: Procedure was tolerated well Post Debridement Measurements of Total Wound Length: (cm) 0.2 Stage: Category/Stage II Width: (cm) 0.2 Depth: (cm) 0.2 Volume: (cm) 0.006 Post Procedure Diagnosis Same as Pre-procedure Electronic Signature(s) Signed: 03/19/2015 1:21:41 PM By: Evlyn Kanner MD, FACS Signed: 03/20/2015 4:32:27 PM By: Elpidio Eric BSN, RN Previous Signature: 03/19/2015 1:08:47 PM Version By: Elpidio Eric BSN, RN [place], Lindon Romp ([id number]) Entered By: Evlyn Kanner on 03/19/2015 13:21:41 [name] [name] ([id number]) -------------------------------------------------------------------------------- HPI Details Patient Name: [name] [name], [name] [name] 03/19/2015 12:45 Date of Service: PM Medical Record [id number] Number: Patient Account Number: [id number] Feb 28, 1961 (54 y.o. Treating RN: Afful, RN, BSN,  Sink Date of Birth/Sex: Female) Other Clinician: Primary Care Physician: WHITE, ELIZABETH Treating Treesa Mccully Referring Physician: WHITE, ELIZABETH Physician/Extender: Weeks in Treatment: 4 History of Present Illness Location: right forefoot medially near her bunion Quality: Patient reports experiencing a dull pain to affected area(s). Severity: Patient states wound are getting worse. Duration: Patient has had the wound for < 5 weeks prior to presenting for treatment Timing: Pain in wound is Intermittent (comes and goes Context: The wound occurred when the patient wore some ill fitting shoes Modifying Factors: she has had abnormality of her toes for a while and has seen her ankle and foot surgeon for the left side. Associated Signs and Symptoms: Patient reports presence of swelling HPI Description: 54 year old patient whose had abnormalities of both feet but her left foot was operated on by a foot and ankle surgeon and she had this in February of this year. He had taken some x-rays of  the right foot but not address this deformity yet. She says about a month ago she was ill fitting shoes for a while and due to this she had a large blister which then opened out and became an ulcer. She's also had some plantar warts on her right foot and has been applying some over-the-counter medication on this. She does not have diabetes mellitus or any  other chronic problems but has been addicted to nicotine and has been smoking about half a pack of cigarettes for about 4 years. 03/04/2015 -- Right foot Xray examination shows no evidence of osteomyelitis. Arthropathic changes are seen at the [facility] and first metatarsal phalangeal joints. she has not yet gotten an appointment with her surgeon for both financial and social reasons. Electronic Signature(s) Signed: 03/19/2015 1:21:51 PM By: Evlyn KannerBritto, Sarahgrace Broman MD, FACS Entered By: Evlyn KannerBritto, Synthia Fairbank on 03/19/2015 13:21:51 [name] [name], [name] L. ([id number]) -------------------------------------------------------------------------------- Physical Exam Details Patient Name: [name] [name], [name] L. 03/19/2015 12:45 Date of Service: PM Medical Record [id number] Number: Patient Account Number: [id number] 02/11/1961 (54 y.o. Treating RN: Clover MealyAfful, RN, BSN, [place] Sinkita Date of Birth/Sex: Female) Other Clinician: Primary Care Physician: WHITE, ELIZABETH Treating Afnan Emberton Referring Physician: WHITE, ELIZABETH Physician/Extender: Weeks in Treatment: 4 Constitutional . Pulse regular. Respirations normal and unlabored. Afebrile. . Eyes Nonicteric. Reactive to light. Ears, Nose, Mouth, and Throat Lips, teeth, and gums WNL.Marland Kitchen. Moist mucosa without lesions . Neck supple and nontender. No palpable supraclavicular or cervical adenopathy. Normal sized without goiter. Respiratory WNL. No retractions.. Cardiovascular Pedal Pulses WNL. No clubbing, cyanosis or edema. Lymphatic No adneopathy. No adenopathy. No adenopathy. Musculoskeletal Adexa without tenderness or  enlargement.. Digits and nails w/o clubbing, cyanosis, infection, petechiae, ischemia, or inflammatory conditions.. Integumentary (Hair, Skin) No suspicious lesions. No crepitus or fluctuance. No peri-wound warmth or erythema. No masses.Marland Kitchen. Psychiatric Judgement and insight Intact.. No evidence of depression, anxiety, or agitation.. Notes once the eschar was removed the subcutaneous region is clean and much smaller and has filled in nicely. Electronic Signature(s) Signed: 03/19/2015 1:22:23 PM By: Evlyn KannerBritto, Kaylea Mounsey MD, FACS Entered By: Evlyn KannerBritto, Daquawn Seelman on 03/19/2015 13:22:23 [name] [name], Tamira L. ([id number]) -------------------------------------------------------------------------------- Physician Orders Details Patient Name: [name] [name], Lihanna L. 03/19/2015 12:45 Date of Service: PM Medical Record [id number] Number: Patient Account Number: [id number] 08/29/1960 (54 y.o. Treating RN: Clover MealyAfful, RN, BSN, [place] Sinkita Date of Birth/Sex: Female) Other Clinician: Primary Care Physician: WHITE, ELIZABETH Treating Monzerrat Wellen Referring Physician: Doristine MangoWHITE, ELIZABETH Physician/Extender: Weeks in Treatment: 4 Verbal / Phone Orders: Yes Clinician: Afful, RN, BSN, Rita Read Back and Verified: Yes Diagnosis Coding Wound Cleansing Wound #1 Right,Medial Metatarsal head first o Clean wound with Normal Saline. o May Shower, gently pat wound dry prior to applying new dressing. Anesthetic Wound #1 Right,Medial Metatarsal head first o Topical Lidocaine 4% cream applied to wound bed prior to debridement Primary Wound Dressing Wound #1 Right,Medial Metatarsal head first o Iodoflex Secondary Dressing Wound #1 Right,Medial Metatarsal head first o Gauze and Kerlix/Conform - Felt surrounding wound for offloading Dressing Change Frequency Wound #1 Right,Medial Metatarsal head first o Change dressing every other day. Follow-up Appointments Wound #1 Right,Medial Metatarsal head first o Return Appointment in 1  week. Electronic Signature(s) Signed: 03/19/2015 1:09:23 PM By: Elpidio EricAfful, Rita BSN, RN Signed: 03/19/2015 4:34:10 PM By: Evlyn KannerBritto, Debora Stockdale MD, FACS Entered By: Elpidio EricAfful, Rita on 03/19/2015 13:09:23 [name] [name], [name] L. ([id number]) -------------------------------------------------------------------------------- Problem List Details Patient Name: [name] [name], [name] L. 03/19/2015 12:45 Date of Service: PM Medical Record [id number] Number: Patient Account Number: [id number] 05/22/1960 (54 y.o. 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 Treating RN: Afful, RN, BSN, [place] Sink Date of Birth/Sex: Female) Other Clinician: Primary Care Physician: WHITE, ELIZABETH Treating Nabeel Gladson Referring Physician: WHITE, ELIZABETH Physician/Extender: Weeks in Treatment: 4 Subjective Chief Complaint Information obtained from Patient Patient presents to the wound care center for a consult due non healing wound She's had a large callus on the medial part of her right forefoot and  this has been there for over a month. History of Present Illness (HPI) The following HPI elements were documented for the patient's wound: Location: right forefoot medially near her bunion Quality: Patient reports experiencing a dull pain to affected area(s). Severity: Patient states wound are getting worse. Duration: Patient has had the wound for < 5 weeks prior to presenting for treatment Timing: Pain in wound is Intermittent (comes and goes Context: The wound occurred when the patient wore some ill fitting shoes Modifying Factors: she has had abnormality of her toes for a while and has seen her ankle and foot surgeon for the left side. Associated Signs and Symptoms: Patient reports presence of swelling 54 year old patient whose had abnormalities of both feet but her left foot was operated on by a foot and ankle surgeon and she had this in February of this year. He had taken some x-rays of the right foot but not address this deformity yet. She says about a month ago she was ill fitting shoes for a while and due to this she had a large blister which then opened out and became an ulcer. She's also had some plantar warts on her right foot and has been applying some over-the-counter medication on this. She does not have diabetes mellitus or any other chronic problems but has been addicted to nicotine and has been smoking about half a pack of cigarettes for about 4 years. 03/04/2015 -- Right foot Xray examination shows no evidence of osteomyelitis. Arthropathic changes are seen at the [facility] and first metatarsal phalangeal joints. she has not yet gotten an appointment with her surgeon for both financial and social reasons. Lenahan, Tayleigh L. ([id number]) Objective Constitutional Pulse regular. Respirations normal and unlabored. Afebrile. Vitals Time Taken: 12:54 PM, Height: 67 in, Weight: 198 lbs, BMI: 31, Temperature: 98.3 F, Pulse: 94 bpm, Respiratory Rate: 18 breaths/min, Blood  Pressure: 118/65 mmHg. Eyes Nonicteric. Reactive to light. Ears, Nose, Mouth, and Throat Lips, teeth, and gums WNL.Marland Kitchen Moist mucosa without lesions . Neck supple and nontender. No palpable supraclavicular or cervical adenopathy. Normal sized without goiter. Respiratory WNL. No retractions.. Cardiovascular Pedal Pulses WNL. No clubbing, cyanosis or edema. Lymphatic No adneopathy. No adenopathy. No adenopathy. Musculoskeletal Adexa without tenderness or enlargement.. Digits and nails w/o clubbing, cyanosis, infection, petechiae, ischemia, or inflammatory conditions.Marland Kitchen Psychiatric Judgement and insight Intact.. No evidence of depression, anxiety, or agitation.. General Notes: once the eschar was removed the subcutaneous region is clean and much smaller and has filled in nicely. Integumentary (Hair, Skin) No suspicious lesions. No crepitus or fluctuance. No peri-wound warmth or erythema. No masses.. Wound #1 status is Open. Original cause of wound was Blister. The wound is located on the Right,Medial Metatarsal head first. The wound measures 0.1cm length x 0.1cm width x 0.1cm depth; 0.008cm^2 area and 0.001cm^3 volume. [name] [name], [name] [name] ([id number]) Assessment Active  Problems ICD-10 L97.512 - Non-pressure chronic ulcer of other part of right foot with fat layer exposed M21.611 - Bunion of right foot F17.218 - Nicotine dependence, cigarettes, with other nicotine-induced disorders Procedures Wound #1 Wound #1 is a Pressure Ulcer located on the Right,Medial Metatarsal head first . There was a Non-Viable Tissue Open Wound/Selective ([id number]) debridement with total area of 0.01 sq cm performed by Evlyn Kanner, MD. with the following instrument(s): Forceps to remove Non-Viable tissue/material including Exudate, Fibrin/Slough, Eschar, Skin, and Subcutaneous after achieving pain control using Lidocaine 4% Topical Solution. A time out was conducted prior to the start of the procedure. A  Minimum amount of bleeding was controlled with Pressure. The procedure was tolerated well with a pain level of 0 throughout and a pain level of 0 following the procedure. Post Debridement Measurements: 0.2cm length x 0.2cm width x 0.2cm depth; 0.006cm^3 volume. Post debridement Stage noted as Category/Stage II. Post procedure Diagnosis Wound #1: Same as Pre-Procedure Plan Wound Cleansing: Wound #1 Right,Medial Metatarsal head first: Clean wound with Normal Saline. May Shower, gently pat wound dry prior to applying new dressing. Anesthetic: Wound #1 Right,Medial Metatarsal head first: Topical Lidocaine 4% cream applied to wound bed prior to debridement Primary Wound Dressing: Wound #1 Right,Medial Metatarsal head first: Iodoflex Secondary Dressing: Wound #1 Right,Medial Metatarsal head first: Gauze and Kerlix/Conform - Felt surrounding wound for offloading Dressing Change Frequency: Wound #1 Right,Medial Metatarsal head first: Albornoz, Wania L. ([id number]) Change dressing every other day. Follow-up Appointments: Wound #1 Right,Medial Metatarsal head first: Return Appointment in 1 week. I have recommended Iodosorb to be placed on this area with a small packing strip if possible. She understands the treatment plan and will be back to see me next week. Electronic Signature(s) Signed: 03/19/2015 1:23:13 PM By: Evlyn Kanner MD, FACS Entered By: Evlyn Kanner on 03/19/2015 13:23:13 [name] [name] ([id number]) -------------------------------------------------------------------------------- SuperBill Details Patient Name: [name] [name] Date of Service: 03/19/2015 Medical Record Patient Account Number: [id number] Number: Afful, RN, BSN, Treating RN: [birth year] (54 y.o. [place] Sink Date of Birth/Sex: Female) Other Clinician: Primary Care Physician: WHITE, ELIZABETH Treating Tnya Ades Referring Physician: WHITE, Lanora Manis Physician/Extender: Weeks in Treatment:  4 Diagnosis Coding ICD-10 Codes Code Description [phone] Non-pressure chronic ulcer of other part of right foot with fat layer exposed M21.611 Bunion of right foot F17.218 Nicotine dependence, cigarettes, with other nicotine-induced disorders Facility Procedures CPT4 Code Description: [id number] - DEBRIDE WOUND 1ST 20 SQ CM OR < ICD-10 Description Diagnosis L97.512 Non-pressure chronic ulcer of other part of right foo M21.611 Bunion of right foot F17.218 Nicotine dependence, cigarettes, with other nicotine- Modifier: t with fat la induced disor Quantity: 1 yer exposed ders Physician Procedures CPT4 Code Description: [id number] - WC PHYS DEBR WO ANESTH 20 SQ CM ICD-10 Description Diagnosis L97.512 Non-pressure chronic ulcer of other part of right foo M21.611 Bunion of right foot F17.218 Nicotine dependence, cigarettes, with other nicotine- Modifier: t with fat lay induced disord Quantity: 1 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

## 2015-03-25 ENCOUNTER — Ambulatory Visit: Payer: BLUE CROSS/BLUE SHIELD | Admitting: Surgery

## 2015-03-29 ENCOUNTER — Encounter: Payer: BLUE CROSS/BLUE SHIELD | Admitting: Surgery

## 2015-03-29 DIAGNOSIS — L97512 Non-pressure chronic ulcer of other part of right foot with fat layer exposed: Secondary | ICD-10-CM | POA: Diagnosis not present

## 2015-03-30 NOTE — Progress Notes (Signed)
[name], [name] ([id number]) Visit Report for 03/29/2015 Arrival Information Details Patient Name: [name], [name] Date of Service: 03/29/2015 2:00 PM Medical Record Number: [id number] Patient Account Number: [id number] Date of Birth/Sex: 1961-01-01 (54 y.o. Female) Treating RN: Afful, RN, BSN, [place] Sink Primary Care Physician: Doristine Mango Other Clinician: Referring Physician: WHITE, Lanora Manis Treating Physician/Extender: Rudene Re in Treatment: 5 Visit Information History Since Last Visit Any new allergies or adverse reactions: No Patient Arrived: Ambulatory Had a fall or experienced change in No Arrival Time: 14:10 activities of daily living that may affect Accompanied By: SELF risk of falls: Transfer Assistance: None Signs or symptoms of abuse/neglect No Patient Identification Verified: Yes since last visito Secondary Verification Process Yes Hospitalized since last visit: No Completed: Has Dressing in Place as Prescribed: Yes Patient Has Alerts: Yes Has Footwear/Offloading in Place as Yes Patient Alerts: Patient on Blood Prescribed: Thinner Right: Surgical Shoe with NOT Daibetic Pressure Relief Insole Pain Present Now: No Electronic Signature(s) Signed: 03/29/2015 2:12:00 PM By: Elpidio Eric BSN, RN Entered By: Elpidio Eric on 03/29/2015 14:12:00 [name] [name] ([id number]) -------------------------------------------------------------------------------- Clinic Level of Care Assessment Details Patient Name: [name] [name] Date of Service: 03/29/2015 2:00 PM Medical Record Number: [id number] Patient Account Number: [id number] Date of Birth/Sex: [birth year] (54 y.o. Female) Treating RN: Afful, RN, BSN, Psychologist, clinical Primary Care Physician: Doristine Mango Other Clinician: Referring Physician: WHITE, Lanora Manis Treating Physician/Extender: Rudene Re in Treatment: 5 Clinic Level of Care Assessment Items TOOL 4 Quantity Score  - Use when only an  EandM is performed on FOLLOW-UP visit 0 ASSESSMENTS - Nursing Assessment / Reassessment X - Reassessment of Co-morbidities (includes updates in patient status) 1 10 X - Reassessment of Adherence to Treatment Plan 1 5 ASSESSMENTS - Wound and Skin Assessment / Reassessment  - Simple Wound Assessment / Reassessment - one wound 0  - Complex Wound Assessment / Reassessment - multiple wounds 0  - Dermatologic / Skin Assessment (not related to wound area) 0 ASSESSMENTS - Focused Assessment  - Circumferential Edema Measurements - multi extremities 0  - Nutritional Assessment / Counseling / Intervention 0  - Lower Extremity Assessment (monofilament, tuning fork, pulses) 0  - Peripheral Arterial Disease Assessment (using hand held doppler) 0 ASSESSMENTS - Ostomy and/or Continence Assessment and Care  - Incontinence Assessment and Management 0  - Ostomy Care Assessment and Management (repouching, etc.) 0 PROCESS - Coordination of Care X - Simple Patient / Family Education for ongoing care 1 15  - Complex (extensive) Patient / Family Education for ongoing care 0  - Staff obtains Chiropractor, Records, Test Results / Process Orders 0  - Staff telephones HHA, Nursing Homes / Clarify orders / etc 0  - Routine Transfer to another Facility (non-emergent condition) 0 Coston, Glendon L. ([id number])  - Routine Hospital Admission (non-emergent condition) 0  - New Admissions / Manufacturing engineer / Ordering NPWT, Apligraf, etc. 0  - Emergency Hospital Admission (emergent condition) 0 X - Simple Discharge Coordination 1 10  - Complex (extensive) Discharge Coordination 0 PROCESS - Special Needs  - Pediatric / Minor Patient Management 0  - Isolation Patient Management 0  - Hearing / Language / Visual special needs 0  - Assessment of Community assistance (transportation, D/C planning, etc.) 0  - Additional assistance / Altered mentation 0  - Support Surface(s)  Assessment (bed, cushion, seat, etc.) 0 INTERVENTIONS - Wound Cleansing / Measurement  - Simple Wound Cleansing - one wound 0  - Complex Wound Cleansing - multiple wounds 0  X - Wound Imaging (photographs - any number of wounds) 1 5  - Wound Tracing (instead of photographs) 0  - Simple Wound Measurement - one wound 0  - Complex Wound Measurement - multiple wounds 0 INTERVENTIONS - Wound Dressings  - Small Wound Dressing one or multiple wounds 0  - Medium Wound Dressing one or multiple wounds 0  - Large Wound Dressing one or multiple wounds 0  - Application of Medications - topical 0  - Application of Medications - injection 0 INTERVENTIONS - Miscellaneous  - External ear exam 0 Bales, Aliyha L. ([id number])  - Specimen Collection (cultures, biopsies, blood, body fluids, etc.) 0  - Specimen(s) / Culture(s) sent or taken to Lab for analysis 0  - Patient Transfer (multiple staff / Michiel Sites Lift / Similar devices) 0  - Simple Staple / Suture removal (25 or less) 0  - Complex Staple / Suture removal (26 or more) 0  - Hypo / Hyperglycemic Management (close monitor of Blood Glucose) 0  - Ankle / Brachial Index (ABI) - do not check if billed separately 0 X - Vital Signs 1 5 Has the patient been seen at the hospital within the last three years: Yes Total Score: 50 Level Of Care: New/Established - Level 2 Electronic Signature(s) Signed: 03/29/2015 2:23:06 PM By: Elpidio Eric BSN, RN Entered By: Elpidio Eric on 03/29/2015 14:23:05 [name] [name] ([id number]) -------------------------------------------------------------------------------- Encounter Discharge Information Details Patient Name: [name] [name] Date of Service: 03/29/2015 2:00 PM Medical Record Number: [id number] Patient Account Number: [id number] Date of Birth/Sex: Jan 21, 1961 (54 y.o. Female) Treating RN: Afful, RN, BSN, [place] Sink Primary Care Physician: Doristine Mango Other Clinician: Referring  Physician: Doristine Mango Treating Physician/Extender: Rudene Re in Treatment: 5 Encounter Discharge Information Items Discharge Pain Level: 0 Discharge Condition: Stable Ambulatory Status: Ambulatory Discharge Destination: Home Transportation: Private Auto Accompanied By: self Schedule Follow-up Appointment: No Medication Reconciliation completed and provided to Patient/Care No Castor Gittleman: Provided on Clinical Summary of Care: 03/29/2015 Form Type Recipient Paper Patient CE Electronic Signature(s) Signed: 03/29/2015 2:26:31 PM By: Gwenlyn Perking Previous Signature: 03/29/2015 2:25:57 PM Version By: Elpidio Eric BSN, RN Entered By: Gwenlyn Perking on 03/29/2015 14:26:31 [name] [name] ([id number]) -------------------------------------------------------------------------------- General Visit Notes Details Patient Name: [name] [name] Date of Service: 03/29/2015 2:00 PM Medical Record Number: [id number] Patient Account Number: [id number] Date of Birth/Sex: 29-Jun-1960 (54 y.o. Female) Treating RN: Clover Mealy, RN, BSN, [place] Sink Primary Care Physician: Doristine Mango Other Clinician: Referring Physician: WHITE, Lanora Manis Treating Physician/Extender: Rudene Re in Treatment: 5 Notes Boarded foam dressing applied to healed area for protection. Electronic Signature(s) Signed: 03/29/2015 2:25:40 PM By: Elpidio Eric BSN, RN Entered By: Elpidio Eric on 03/29/2015 14:25:40 [name] [name] ([id number]) -------------------------------------------------------------------------------- Lower Extremity Assessment Details Patient Name: [name] [name] Date of Service: 03/29/2015 2:00 PM Medical Record Number: [id number] Patient Account Number: [id number] Date of Birth/Sex: [birth year] (54 y.o. Female) Treating RN: Afful, RN, BSN, [place] Sink Primary Care Physician: Doristine Mango Other Clinician: Referring Physician: WHITE, Lanora Manis Treating Physician/Extender: Rudene Re in Treatment: 5 Vascular Assessment Pulses: Posterior Tibial Dorsalis Pedis Palpable: [Right:Yes] Extremity colors, hair growth, and conditions: Extremity Color: [Right:Normal] Hair Growth on Extremity: [Right:Yes] Temperature of Extremity: [Right:Warm] Capillary Refill: [Right:< 3 seconds] Toe Nail Assessment Left: Right: Thick: No Discolored: No Deformed: No Improper Length and Hygiene: No Electronic Signature(s) Signed: 03/29/2015 2:17:50 PM By: Elpidio Eric BSN, RN Entered By: Elpidio Eric on 03/29/2015 14:17:49 Rosamilia, Lindon Romp ([id number]) -------------------------------------------------------------------------------- Multi Wound Chart Details Patient Name: Estanislado Pandy  L. Date of Service: 03/29/2015 2:00 PM Medical Record Number: [id number] Patient Account Number: [id number] Date of Birth/Sex: 02-28-1961 (54 y.o. Female) Treating RN: Clover Mealy, RN, BSN, [place] Sink Primary Care Physician: Doristine Mango Other Clinician: Referring Physician: WHITE, Lanora Manis Treating Physician/Extender: Rudene Re in Treatment: 5 Vital Signs Height(in): 67 Pulse(bpm): 90 Weight(lbs): 198 Blood Pressure 127/70 (mmHg): Body Mass Index(BMI): 31 Temperature(F): 98.2 Respiratory Rate 18 (breaths/min): Photos: [1:No Photos] [N/A:N/A] Wound Location: [1:Right Metatarsal head first N/A - Medial] Wounding Event: [1:Blister] [N/A:N/A] Primary Etiology: [1:Pressure Ulcer] [N/A:N/A] Comorbid History: [1:Hypertension, Osteoarthritis] [N/A:N/A] Date Acquired: [1:01/14/2015] [N/A:N/A] Weeks of Treatment: [1:5] [N/A:N/A] Wound Status: [1:Healed - Epithelialized] [N/A:N/A] Measurements L x W x D 0x0x0 [N/A:N/A] (cm) Area (cm) : [1:0] [N/A:N/A] Volume (cm) : [1:0] [N/A:N/A] % Reduction in Area: [1:100.00%] [N/A:N/A] % Reduction in Volume: 100.00% [N/A:N/A] Classification: [1:Category/Stage II] [N/A:N/A] Exudate Amount: [1:None Present] [N/A:N/A] Wound Margin:  [1:Distinct, outline attached N/A] Granulation Amount: [1:None Present (0%)] [N/A:N/A] Necrotic Amount: [1:None Present (0%)] [N/A:N/A] Exposed Structures: [1:Fascia: No Fat: No Tendon: No Muscle: No Joint: No Bone: No Limited to Skin Breakdown] [N/A:N/A] Epithelialization: [1:None] [N/A:N/A] Periwound Skin Texture: Edema: No N/A N/A Excoriation: No Induration: No Callus: No Crepitus: No Fluctuance: No Friable: No Rash: No Scarring: No Periwound Skin Dry/Scaly: Yes N/A N/A Moisture: Maceration: No Moist: No Periwound Skin Color: Atrophie Blanche: No N/A N/A Cyanosis: No Ecchymosis: No Erythema: No Hemosiderin Staining: No Mottled: No Pallor: No Rubor: No Temperature: No Abnormality N/A N/A Tenderness on No N/A N/A Palpation: Wound Preparation: Ulcer Cleansing: N/A N/A Rinsed/Irrigated with Saline Topical Anesthetic Applied: None Treatment Notes Electronic Signature(s) Signed: 03/29/2015 2:22:19 PM By: Elpidio Eric BSN, RN Entered By: Elpidio Eric on 03/29/2015 14:22:19 [name] [name] ([id number]) -------------------------------------------------------------------------------- Multi-Disciplinary Care Plan Details Patient Name: [name] [name] Date of Service: 03/29/2015 2:00 PM Medical Record Number: [id number] Patient Account Number: [id number] Date of Birth/Sex: 04-19-1961 (54 y.o. Female) Treating RN: Afful, RN, BSN, [place] Sink Primary Care Physician: Doristine Mango Other Clinician: Referring Physician: WHITE, Lanora Manis Treating Physician/Extender: Rudene Re in Treatment: 5 Active Inactive Orientation to the Wound Care Program Nursing Diagnoses: Knowledge deficit related to the wound healing center program Goals: Patient/caregiver will verbalize understanding of the Wound Healing Center Program Date Initiated: 02/18/2015 Goal Status: Active Interventions: Provide education on orientation to the wound center Notes: Pressure Nursing  Diagnoses: Knowledge deficit related to management of pressures ulcers Potential for impaired tissue integrity related to pressure, friction, moisture, and shear Goals: Patient will remain free of pressure ulcers Date Initiated: 02/18/2015 Goal Status: Active Interventions: Assess potential for pressure ulcer upon admission and as needed Notes: Wound/Skin Impairment Nursing Diagnoses: Knowledge deficit related to ulceration/compromised skin integrity Goals: Ulcer/skin breakdown will heal within 14 weeks Dugo, Vaida L. ([id number]) Date Initiated: 02/18/2015 Goal Status: Active Interventions: Assess patient/caregiver ability to perform ulcer/skin care regimen upon admission and as needed Treatment Activities: Topical wound management initiated : 03/29/2015 Notes: Electronic Signature(s) Signed: 03/29/2015 2:20:10 PM By: Elpidio Eric BSN, RN Entered By: Elpidio Eric on 03/29/2015 14:20:10 [name] [name] ([id number]) -------------------------------------------------------------------------------- Pain Assessment Details Patient Name: [name] [name] Date of Service: 03/29/2015 2:00 PM Medical Record Number: [id number] Patient Account Number: [id number] Date of Birth/Sex: 1960-12-04 (54 y.o. Female) Treating RN: Afful, RN, BSN, [place] Sink Primary Care Physician: Doristine Mango Other Clinician: Referring Physician: Doristine Mango Treating Physician/Extender: Rudene Re in Treatment: 5 Active Problems Location of Pain Severity and Description of Pain Patient Has Paino No Site Locations Pain Management and Medication Current Pain  Management: Electronic Signature(s) Signed: 03/29/2015 2:12:16 PM By: Elpidio EricAfful, Rita BSN, RN Entered By: Elpidio EricAfful, Rita on 03/29/2015 14:12:15 [name] BottsEBERLIN, Nysa L. ([id number]) -------------------------------------------------------------------------------- Patient/Caregiver Education Details Patient Name: [name] BottsEBERLIN, Juaquina L. Date of Service:  03/29/2015 2:00 PM Medical Record Number: [id number] Patient Account Number: [id number] Date of Birth/Gender: 06/11/1960 (54 y.o. Female) Treating RN: Afful, RN, BSN, [place] Sinkita Primary Care Physician: Doristine MangoWHITE, ELIZABETH Other Clinician: Referring Physician: Doristine MangoWHITE, ELIZABETH Treating Physician/Extender: Rudene ReBritto, Errol Weeks in Treatment: 5 Education Assessment Education Provided To: Patient Education Topics Provided Welcome To The Wound Care Center: Methods: Explain/Verbal Responses: State content correctly Electronic Signature(s) Signed: 03/29/2015 2:26:07 PM By: Elpidio EricAfful, Rita BSN, RN Entered By: Elpidio EricAfful, Rita on 03/29/2015 14:26:07 [name] BottsEBERLIN, Samani L. ([id number]) -------------------------------------------------------------------------------- Wound Assessment Details Patient Name: [name] BottsEBERLIN, Aviyana L. Date of Service: 03/29/2015 2:00 PM Medical Record Number: [id number] Patient Account Number: [id number] Date of Birth/Sex: 06/13/1960 (54 y.o. Female) Treating RN: Afful, RN, BSN, Psychologist, clinicalita Primary Care Physician: Doristine MangoWHITE, ELIZABETH Other Clinician: Referring Physician: WHITE, Lanora ManisELIZABETH Treating Physician/Extender: Rudene ReBritto, Errol Weeks in Treatment: 5 Wound Status Wound Number: 1 Primary Etiology: Pressure Ulcer Wound Location: Right Metatarsal head first - Wound Status: Healed - Epithelialized Medial Comorbid History: Hypertension, Osteoarthritis Wounding Event: Blister Date Acquired: 01/14/2015 Weeks Of Treatment: 5 Clustered Wound: No Photos Photo Uploaded By: Elpidio EricAfful, Rita on 03/29/2015 16:35:16 Wound Measurements Length: (cm) 0 % Reductio Width: (cm) 0 % Reductio Depth: (cm) 0 Epithelial Area: (cm) 0 Tunneling Volume: (cm) 0 Undermini n in Area: 100% n in Volume: 100% ization: None : No ng: No Wound Description Classification: Category/Stage II Wound Margin: Distinct, outline attached Exudate Amount: None Present Foul Odor After Cleansing: No Wound Bed Granulation Amount: None  Present (0%) Exposed Structure Necrotic Amount: None Present (0%) Fascia Exposed: No Fat Layer Exposed: No Tendon Exposed: No Muscle Exposed: No [place]ton, Javae L. ([id number]) Joint Exposed: No Bone Exposed: No Limited to Skin Breakdown Periwound Skin Texture Texture Color No Abnormalities Noted: No No Abnormalities Noted: No Callus: No Atrophie Blanche: No Crepitus: No Cyanosis: No Excoriation: No Ecchymosis: No Fluctuance: No Erythema: No Friable: No Hemosiderin Staining: No Induration: No Mottled: No Localized Edema: No Pallor: No Rash: No Rubor: No Scarring: No Temperature / Pain Moisture Temperature: No Abnormality No Abnormalities Noted: No Dry / Scaly: Yes Maceration: No Moist: No Wound Preparation Ulcer Cleansing: Rinsed/Irrigated with Saline Topical Anesthetic Applied: None Electronic Signature(s) Signed: 03/29/2015 2:22:10 PM By: Elpidio EricAfful, Rita BSN, RN Previous Signature: 03/29/2015 2:15:44 PM Version By: Elpidio EricAfful, Rita BSN, RN Entered By: Elpidio EricAfful, Rita on 03/29/2015 14:22:10 [name] BottsEBERLIN, Laurina L. ([id number]) -------------------------------------------------------------------------------- Vitals Details Patient Name: [name] BottsEBERLIN, Kennidee L. Date of Service: 03/29/2015 2:00 PM Medical Record Number: [id number] Patient Account Number: [id number] Date of Birth/Sex: 07/24/1960 (54 y.o. Female) Treating RN: Afful, RN, BSN, [place] Sinkita Primary Care Physician: Doristine MangoWHITE, ELIZABETH Other Clinician: Referring Physician: WHITE, Lanora ManisELIZABETH Treating Physician/Extender: Rudene ReBritto, Errol Weeks in Treatment: 5 Vital Signs Time Taken: 14:12 Temperature (F): 98.2 Height (in): 67 Pulse (bpm): 90 Weight (lbs): 198 Respiratory Rate (breaths/min): 18 Body Mass Index (BMI): 31 Blood Pressure (mmHg): 127/70 Reference Range: 80 - 120 mg / dl Electronic Signature(s) Signed: 03/29/2015 2:12:41 PM By: Elpidio EricAfful, Rita BSN, RN Entered By: Elpidio EricAfful, Rita on 03/29/2015 14:12:41

## 2015-03-31 NOTE — Progress Notes (Signed)
[name] [name], [name] [name] ([id number]) Visit Report for 03/29/2015 Chief Complaint Document Details Patient Name: [name] [name], [name] [name] 03/29/2015 2:00 Date of Service: PM Medical Record [id number] Number: Patient Account Number: [id number] 10/14/1960 (54 y.o. Treating RN: Afful, RN, BSN, [place] Sink Date of Birth/Sex: Female) Other Clinician: Primary Care Physician: WHITE, ELIZABETH Treating Letina Luckett Referring Physician: WHITE, ELIZABETH Physician/Extender: Weeks in Treatment: 5 Information Obtained from: Patient Chief Complaint Patient presents to the wound care center for a consult due non healing wound She's had a large callus on the medial part of her right forefoot and this has been there for over a month. Electronic Signature(s) Signed: 03/29/2015 2:25:21 PM By: Evlyn Kanner MD, FACS Entered By: Evlyn Kanner on 03/29/2015 14:25:21 [name] [name] ([id number]) -------------------------------------------------------------------------------- HPI Details Patient Name: [name] [name] 03/29/2015 2:00 Date of Service: PM Medical Record [id number] Number: Patient Account Number: [id number] 26-Feb-1961 (54 y.o. Treating RN: Afful, RN, BSN, Lexa Sink Date of Birth/Sex: Female) Other Clinician: Primary Care Physician: WHITE, ELIZABETH Treating Jaqua Ching Referring Physician: WHITE, ELIZABETH Physician/Extender: Weeks in Treatment: 5 History of Present Illness Location: right forefoot medially near her bunion Quality: Patient reports experiencing a dull pain to affected area(s). Severity: Patient states wound are getting worse. Duration: Patient has had the wound for < 5 weeks prior to presenting for treatment Timing: Pain in wound is Intermittent (comes and goes Context: The wound occurred when the patient wore some ill fitting shoes Modifying Factors: she has had abnormality of her toes for a while and has seen her ankle and foot surgeon for the left side. Associated Signs and  Symptoms: Patient reports presence of swelling HPI Description: 54 year old patient whose had abnormalities of both feet but her left foot was operated on by a foot and ankle surgeon and she had this in February of this year. He had taken some x-rays of the right foot but not address this deformity yet. She says about a month ago she was ill fitting shoes for a while and due to this she had a large blister which then opened out and became an ulcer. She's also had some plantar warts on her right foot and has been applying some over-the-counter medication on this. She does not have diabetes mellitus or any other chronic problems but has been addicted to nicotine and has been smoking about half a pack of cigarettes for about 4 years. 03/04/2015 -- Right foot Xray examination shows no evidence of osteomyelitis. Arthropathic changes are seen at the [facility] and first metatarsal phalangeal joints. she has not yet gotten an appointment with her surgeon for both financial and social reasons. Electronic Signature(s) Signed: 03/29/2015 2:25:27 PM By: Evlyn Kanner MD, FACS Entered By: Evlyn Kanner on 03/29/2015 14:25:26 [name] [name] ([id number]) -------------------------------------------------------------------------------- Physical Exam Details Patient Name: [name] [name] 03/29/2015 2:00 Date of Service: PM Medical Record [id number] Number: Patient Account Number: [id number] 09-Dec-1960 (54 y.o. Treating RN: Clover Mealy, RN, BSN, [place] Sink Date of Birth/Sex: Female) Other Clinician: Primary Care Physician: WHITE, ELIZABETH Treating Paidyn Mcferran Referring Physician: WHITE, ELIZABETH Physician/Extender: Weeks in Treatment: 5 Constitutional . Pulse regular. Respirations normal and unlabored. Afebrile. . Eyes Nonicteric. Reactive to light. Ears, Nose, Mouth, and Throat Lips, teeth, and gums WNL.Marland Kitchen Moist mucosa without lesions . Neck supple and nontender. No palpable supraclavicular or cervical  adenopathy. Normal sized without goiter. Respiratory WNL. No retractions.. Cardiovascular Pedal Pulses WNL. No clubbing, cyanosis or edema. Lymphatic No adneopathy. No adenopathy. No adenopathy. Musculoskeletal Adexa without tenderness or enlargement.. Digits and nails w/o clubbing, cyanosis,  infection, petechiae, ischemia, or inflammatory conditions.. Integumentary (Hair, Skin) No suspicious lesions. No crepitus or fluctuance. No peri-wound warmth or erythema. No masses.Marland Kitchen. Psychiatric Judgement and insight Intact.. No evidence of depression, anxiety, or agitation.. Notes the wound is completely healed and after gently removing the eschar there is no open ulceration. Electronic Signature(s) Signed: 03/29/2015 2:26:40 PM By: Evlyn KannerBritto, Teighan Aubert MD, FACS Entered By: Evlyn KannerBritto, Deaaron Fulghum on 03/29/2015 14:26:40 [name] BottsEBERLIN, [name] ([id number]) -------------------------------------------------------------------------------- Physician Orders Details Patient Name: [name] BottsBERLIN, [name] 03/29/2015 2:00 Date of Service: PM Medical Record [id number] Number: Patient Account Number: [id number] 12/14/1960 (54 y.o. Treating RN: Clover MealyAfful, RN, BSN, [place] Sinkita Date of Birth/Sex: Female) Other Clinician: Primary Care Physician: WHITE, ELIZABETH Treating Ambar Raphael Referring Physician: Doristine MangoWHITE, ELIZABETH Physician/Extender: Weeks in Treatment: 5 Verbal / Phone Orders: Yes Clinician: Afful, RN, BSN, Rita Read Back and Verified: Yes Diagnosis Coding [facility] Discharge from Wound Care Center - Treatment Completed. Instructed by MD to set up appointment to see podiatry. Verbalized understanding. Electronic Signature(s) Signed: 03/29/2015 2:37:40 PM By: Elpidio EricAfful, Rita BSN, RN Signed: 03/29/2015 4:42:27 PM By: Evlyn KannerBritto, Noreta Kue MD, FACS Previous Signature: 03/29/2015 2:22:42 PM Version By: Elpidio EricAfful, Rita BSN, RN Entered By: Elpidio EricAfful, Rita on 03/29/2015 14:37:40 [name] BottsEBERLIN, [name]  ([id number]) -------------------------------------------------------------------------------- Problem List Details Patient Name: [name] BottsBERLIN, Zelpha L. 03/29/2015 2:00 Date of Service: PM Medical Record [id number] Number: Patient Account Number: [id number] 12/13/1960 (54 y.o. 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 ([id number]) -------------------------------------------------------------------------------- Progress Note Details Patient Name: [name] BottsEBERLIN, Sharna L. 03/29/2015 2:00 Date of Service: PM Medical Record [id number] Number: Patient Account Number: [id number] 03/28/1961 (54 y.o. Treating RN: Afful, RN, BSN,  Sinkita Date of Birth/Sex: Female) Other Clinician: Primary Care Physician: WHITE, ELIZABETH Treating Kaydyn Sayas Referring Physician: WHITE, ELIZABETH Physician/Extender: Weeks in Treatment: 5 Subjective Chief Complaint Information obtained from Patient Patient presents to the wound care center for a consult due non healing wound She's had a large callus on the medial part of her right forefoot and this has been there for over a month. History of Present Illness (HPI) The following HPI elements were documented for the patient's wound: Location: right forefoot medially near her bunion Quality:  Patient reports experiencing a dull pain to affected area(s). Severity: Patient states wound are getting worse. Duration: Patient has had the wound for < 5 weeks prior to presenting for treatment Timing: Pain in wound is Intermittent (comes and goes Context: The wound occurred when the patient wore some ill fitting shoes Modifying Factors: she has had abnormality of her toes for a while and has seen her ankle and foot surgeon for the left side. Associated Signs and Symptoms: Patient reports presence of swelling 54 year old patient whose had abnormalities of both feet but her left foot was operated on by a foot and ankle surgeon and she had this in February of this year. He had taken some x-rays of the right foot but not address this deformity yet. She says about a month ago she was ill fitting  shoes for a while and due to this she had a large blister which then opened out and became an ulcer. She's also had some plantar warts on her right foot and has been applying some over-the-counter medication on this. She does not have diabetes mellitus or any other chronic problems but has been addicted to nicotine and has been smoking about half a pack of cigarettes for about 4 years. 03/04/2015 -- Right foot Xray examination shows no evidence of osteomyelitis. Arthropathic changes are seen at the [facility] and first metatarsal phalangeal joints. she has not yet gotten an appointment with her surgeon for both financial and social reasons. Mogle, Ariabella L. ([id number]) Objective Constitutional Pulse regular. Respirations normal and unlabored. Afebrile. Vitals Time Taken: 2:12 PM, Height: 67 in, Weight: 198 lbs, BMI: 31, Temperature: 98.2 F, Pulse: 90 bpm, Respiratory Rate: 18 breaths/min, Blood Pressure: 127/70 mmHg. Eyes Nonicteric. Reactive to light. Ears, Nose, Mouth, and Throat Lips, teeth, and gums WNL.Marland Kitchen Moist mucosa without lesions . Neck supple and nontender. No palpable supraclavicular or  cervical adenopathy. Normal sized without goiter. Respiratory WNL. No retractions.. Cardiovascular Pedal Pulses WNL. No clubbing, cyanosis or edema. Lymphatic No adneopathy. No adenopathy. No adenopathy. Musculoskeletal Adexa without tenderness or enlargement.. Digits and nails w/o clubbing, cyanosis, infection, petechiae, ischemia, or inflammatory conditions.Marland Kitchen Psychiatric Judgement and insight Intact.. No evidence of depression, anxiety, or agitation.. General Notes: the wound is completely healed and after gently removing the eschar there is no open ulceration. Integumentary (Hair, Skin) No suspicious lesions. No crepitus or fluctuance. No peri-wound warmth or erythema. No masses.. Wound #1 status is Healed - Epithelialized. Original cause of wound was Blister. The wound is located on the Right,Medial Metatarsal head first. The wound measures 0cm length x 0cm width x 0cm depth; 0cm^2 area and 0cm^3 volume. The wound is limited to skin breakdown. There is no tunneling or undermining noted. There is a none present amount of drainage noted. The wound margin is distinct with the outline attached to the wound base. There is no granulation within the wound bed. There is no necrotic tissue within the wound bed. The periwound skin appearance exhibited: Dry/Scaly. The periwound skin appearance did not exhibit: Callus, Crepitus, Excoriation, Fluctuance, Friable, Induration, Localized Edema, Rash, Scarring, Maceration, Moist, Atrophie Blanche, Cyanosis, Ecchymosis, Hemosiderin Staining, Holdren, Ranata L. ([id number]) Mottled, Pallor, Rubor, Erythema. Periwound temperature was noted as No Abnormality. Assessment Active Problems ICD-10 L97.512 - Non-pressure chronic ulcer of other part of right foot with fat layer exposed M21.611 - Bunion of right foot F17.218 - Nicotine dependence, cigarettes, with other nicotine-induced disorders The wound is completely healed and I have discussed with her that  she needs to continue to protect this scar with a padded foam dressing and wear her offloading shoe until she sees a podiatrist. I have reiterated it's very important to see a podiatrist to treat her bunion. She is discharge from the wound care services and will be seen back as needed. Plan Discharge From [facility]: Discharge from Wound Care Center - Treatment Completed. Instructed by MD to set up appointment to see podiatry. Verbalized understanding. The wound is completely healed and I have discussed with her that she needs to continue to protect this scar with a padded foam dressing and wear her offloading shoe until she sees a podiatrist. I have reiterated it's very important to see a podiatrist to treat her bunion. She is discharge from the wound care services and will be seen back as needed. Electronic Signature(s) Signed: 03/29/2015  4:42:13 PM By: Evlyn Kanner MD, FACS Previous Signature: 03/29/2015 4:41:45 PM Version By: Evlyn Kanner MD, FACS Previous Signature: 03/29/2015 2:27:41 PM Version By: Evlyn Kanner MD, FACS [place], Lindon Romp ([id number]) Entered By: Evlyn Kanner on 03/29/2015 16:42:13 [name] [name] ([id number]) -------------------------------------------------------------------------------- SuperBill Details Patient Name: [name] [name] Date of Service: 03/29/2015 Medical Record Patient Account Number: [id number] Number: Afful, RN, BSN, Treating RN: 1960-07-07 (54 y.o. [place] Sink Date of Birth/Sex: Female) Other Clinician: Primary Care Physician: WHITE, ELIZABETH Treating Naeema Patlan Referring Physician: WHITE, Lanora Manis Physician/Extender: Weeks in Treatment: 5 Diagnosis Coding ICD-10 Codes Code Description [phone] Non-pressure chronic ulcer of other part of right foot with fat layer exposed M21.611 Bunion of right foot F17.218 Nicotine dependence, cigarettes, with other nicotine-induced disorders Facility Procedures CPT4 Code:  [id number] Description: 59563 - WOUND CARE VISIT-LEV 2 EST PT Modifier: Quantity: 1 Physician Procedures CPT4 Code Description: [id number] 29518 - WC PHYS LEVEL 2 - EST PT ICD-10 Description Diagnosis L97.512 Non-pressure chronic ulcer of other part of right fo M21.611 Bunion of right foot F17.218 Nicotine dependence, cigarettes, with other nicotine Modifier: ot with fat lay -induced disord Quantity: 1 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

## 2015-08-28 ENCOUNTER — Ambulatory Visit
Admission: RE | Admit: 2015-08-28 | Discharge: 2015-08-28 | Disposition: A | Discharge status: Home / Self Care | Payer: BLUE CROSS/BLUE SHIELD | Source: Ambulatory Visit | Attending: Orthopedic Surgery | Admitting: Orthopedic Surgery

## 2015-08-28 ENCOUNTER — Other Ambulatory Visit: Payer: Self-pay | Admitting: Orthopedic Surgery

## 2015-08-28 DIAGNOSIS — M1612 Unilateral primary osteoarthritis, left hip: Secondary | ICD-10-CM

## 2015-08-28 DIAGNOSIS — R938 Abnormal findings on diagnostic imaging of other specified body structures: Secondary | ICD-10-CM | POA: Insufficient documentation

## 2015-08-28 DIAGNOSIS — M659 Synovitis and tenosynovitis, unspecified: Secondary | ICD-10-CM | POA: Diagnosis not present

## 2015-09-02 ENCOUNTER — Encounter
Admission: RE | Admit: 2015-09-02 | Discharge: 2015-09-02 | Disposition: A | Discharge status: Home / Self Care | Payer: BLUE CROSS/BLUE SHIELD | Source: Ambulatory Visit | Attending: Orthopedic Surgery | Admitting: Orthopedic Surgery

## 2015-09-02 ENCOUNTER — Other Ambulatory Visit: Payer: BLUE CROSS/BLUE SHIELD

## 2015-09-02 HISTORY — DX: Cardiac murmur, unspecified: R01.1

## 2015-09-02 HISTORY — DX: Gastro-esophageal reflux disease without esophagitis: K21.9

## 2015-09-02 HISTORY — DX: Major depressive disorder, single episode, unspecified: F32.9

## 2015-09-02 HISTORY — DX: Depression, unspecified: F32.A

## 2015-09-02 HISTORY — DX: Unspecified osteoarthritis, unspecified site: M19.90

## 2015-09-02 HISTORY — DX: Essential (primary) hypertension: I10

## 2015-09-02 LAB — APTT: aPTT: 30 seconds (ref 24–36)

## 2015-09-02 LAB — BASIC METABOLIC PANEL
ANION GAP: 14 (ref 5–15)
BUN: 9 mg/dL (ref 6–20)
CALCIUM: 9.6 mg/dL (ref 8.9–10.3)
CO2: 26 mmol/L (ref 22–32)
CREATININE: 0.58 mg/dL (ref 0.44–1.00)
Chloride: 89 mmol/L — ABNORMAL LOW (ref 101–111)
Glucose, Bld: 113 mg/dL — ABNORMAL HIGH (ref 65–99)
Potassium: 3.6 mmol/L (ref 3.5–5.1)
SODIUM: 129 mmol/L — AB (ref 135–145)

## 2015-09-02 LAB — URINALYSIS COMPLETE WITH MICROSCOPIC (ARMC ONLY)
BACTERIA UA: NONE SEEN
Bilirubin Urine: NEGATIVE
GLUCOSE, UA: NEGATIVE mg/dL
KETONES UR: NEGATIVE mg/dL
LEUKOCYTES UA: NEGATIVE
NITRITE: NEGATIVE
Protein, ur: NEGATIVE mg/dL
SPECIFIC GRAVITY, URINE: 1.011 (ref 1.005–1.030)
pH: 7 (ref 5.0–8.0)

## 2015-09-02 LAB — SEDIMENTATION RATE: SED RATE: 13 mm/h (ref 0–30)

## 2015-09-02 LAB — SURGICAL PCR SCREEN
MRSA, PCR: NEGATIVE
STAPHYLOCOCCUS AUREUS: NEGATIVE

## 2015-09-02 LAB — PROTIME-INR
INR: 1.05
PROTHROMBIN TIME: 13.9 s (ref 11.4–15.0)

## 2015-09-02 LAB — CBC
HEMATOCRIT: 35.6 % (ref 35.0–47.0)
HEMOGLOBIN: 12.3 g/dL (ref 12.0–16.0)
MCH: 31.4 pg (ref 26.0–34.0)
MCHC: 34.6 g/dL (ref 32.0–36.0)
MCV: 90.7 fL (ref 80.0–100.0)
Platelets: 355 10*3/uL (ref 150–440)
RBC: 3.93 MIL/uL (ref 3.80–5.20)
RDW: 12.9 % (ref 11.5–14.5)
WBC: 8.7 10*3/uL (ref 3.6–11.0)

## 2015-09-02 LAB — TYPE AND SCREEN
ABO/RH(D): O NEG
Antibody Screen: NEGATIVE

## 2015-09-02 LAB — ABO/RH: ABO/RH(D): O NEG

## 2015-09-02 NOTE — Pre-Procedure Instructions (Addendum)
  Your procedure is scheduled on: 09/05/15  Report to Day Surgery. To find out your arrival time please call [phone] between 1PM - 3PM on 4/26.  Remember: Instructions that are not followed completely may result in serious medical risk, up to and including death, or upon the discretion of your surgeon and anesthesiologist your surgery may need to be rescheduled.    ___X_ 1. Do not eat food or drink liquids after midnight. No gum chewing or hard candies.     __X__ 2. No Alcohol for 24 hours before or after surgery.   ____ 3. Bring all medications with you on the day of surgery if instructed.    __X__ 4. Notify your doctor if there is any change in your medical condition     (cold, fever, infections).     Do not wear jewelry, make-up, hairpins, clips or nail polish.  Do not wear lotions, powders, or perfumes. You may wear deodorant.  Do not shave 48 hours prior to surgery. Men may shave face and neck.  Do not bring valuables to the hospital.    [facility] is not responsible for any belongings or valuables.               Contacts, dentures or bridgework may not be worn into surgery.  Leave your suitcase in the car. After surgery it may be brought to your room.  For patients admitted to the hospital, discharge time is determined by your                treatment team.   Patients discharged the day of surgery will not be allowed to drive home.   Please read over the following fact sheets that you were given:   Pain Booklet, MRSA Information and Surgical Site Infection Prevention   __X__ Take these medicines the morning of surgery with A SIP OF WATER:    1. Zestoretic   2. Metoprolol  3.   4.  5.  6.  ____ Fleet Enema (as directed)   _X___ Use CHG Soap as directed  ____ Use inhalers on the day of surgery  ____ Stop metformin 2 days prior to surgery    ____ Take 1/2 of usual insulin dose the night before surgery and none on the morning of surgery.   __X__ Stop aspirin  on today  __X__ Stop Anti-inflammatories on today.   ____ Stop supplements until after surgery.    ____ Bring C-Pap to the hospital.

## 2015-09-02 NOTE — Pre-Procedure Instructions (Signed)
Patient seen in PAT States she is able to perform activities without any shortness of breath of chest pain, she is limited in activity secondary to her joint pain. Expresses history of heart murmer, follows with PCP only. ECHO in 2008 with results as follow:  [facility] Erick BlinksHarvey, Veida [facility] DIAGNOST[facility] ECHO-DOPPLER REPORT [id number] ---------------------------------------------------- Date: 04/28/2007 Age: [age] STUDY: Chest Wall TAPE: 0000:00:00:00: Adult Female ECHO: Yes DOPPLER: Yes FILE: [id number]: Outpatient COLOR: Yes AMYL: No MACHINE: [place]ips MPDC RV BIOPSY: No CONTRAST: No SOUND QLTY: Moderate MD1: Hansell, Harriet Ng BSA: 2.15 MEDIUM: None MD2: ------------------------------------------------------------------------------ HISTORY: Hypertension REASON: Assess LV function INDICATION: 401.9 - Essential hypertension, unspecified.  ECHOCARDIOGRAPHIC MEASUREMENTS ----------------------------------------------- 2D DIMENSIONS AORTA Values Normal Range MAIN PA Values Normal Range Annulus: 1.8 cm  PA Main: nm* cm  Aorta Sin: 2.9 cm  RIGHT VENTRICLE ST Junction: 2.3 cm  RV Base: nm* cm  Asc.Aorta: 2.8 cm  RV Mid: nm* cm  LEFT VENTRICLE RV Length: nm* cm  LVIDd: 4.6 cm  INFERIOR VENA CAVA LVIDs: 2.8 cm Max.IVC: nm* cm  FS: 39%  Min.IVC: nm* cm  SWT: 1.3 cm  __________________ PWT: 1.1 cm  nm* - not measured LEFT ATRIUM LA Diam: 4.5 cm  LA Area: 24 cm2  LA Volume: nm* mL   ECHOCARDIOGRAPHIC DESCRIPTIONS ----------------------------------------------- AORTIC ROOT Size: Normal Dissection: INDETERM FOR DISSECTION  AORTIC VALVE Leaflets: Tricuspid Morphology: Normal Mobility: Fully Mobile  LEFT VENTRICLE Anterior: Normal Size: Normal Lateral: Normal Contraction: Normal Septal: Normal Closest EF: >55% (Estimated) Calc.EF: 72% Apical: Normal LV masses: No Masses Inferior: Normal LVH: MILD LVH CONCENTRIC Posterior: Normal Dias.FxClass: RELAXATION ABNORMALITY  (GRADE 2) CORRESPONDS TO PSEUDONORMAL  MITRAL VALVE Leaflets: Normal Mobility: Fully mobile Morphology: Normal  LEFT ATRIUM Size: MILDLY ENLARGED LA masses: No masses Normal IAS  MAIN PA Size: Normal  PULMONIC VALVE Morphology: Normal Mobility: Fully Mobile  RIGHT VENTRICLE Size: Normal Free wall: Normal Contraction: Normal RV masses: No Masses  TRICUSPID VALVE Leaflets: Normal Mobility: Fully mobile Morphology: Normal  RIGHT ATRIUM Size: Normal RA Other: None RA masses: No masses  PERICARDIUM Fluid: No effusion  INFERIOR VENACAVA Size: Normal Normal respiratory collapse  DOPPLER ECHO and OTHER SPECIAL PROCEDURES ------------------------------------ Aortic: No AR No AS LVOT Vel.: nm* m/s Mitral: TRIVIAL MR No MS MV Inflow E Vel.= nm* cm/s MV Annulus E'Vel.= nm* cm/s E/E'Ratio= nm* Tricuspid: TRIVIAL TR No TS Pulmonary: No PR No PS Other:  INTERPRETATION --------------------------------------------------------------- NORMAL LEFT VENTRICULAR SYSTOLIC FUNCTION WITH MILD LVH NORMAL RIGHT VENTRICULAR SYSTOLIC FUNCTION VALVULAR REGURGITATION: TRIVIAL MR, TRIVIAL TR NO VALVULAR STENOSIS NO PRIOR FOR COMPARISON   (Electronic Signature approved) (Report version 6.0) ---------------------------------------- Perform. by: Tomma RakersJon M Owensby, RRT, RDCSInterpreted by: Chip Boerouglas, Pamela Susan Inter.Fellow: On: 04/28/2007 Resp.Person: Tomma RakersJon M Owensby, RRT, RDCS

## 2015-09-02 NOTE — Pre-Procedure Instructions (Signed)
EKG LBBB, ALSO NOTED ON EKG 2006

## 2015-09-03 NOTE — Pre-Procedure Instructions (Signed)
Urinalysis and EKG sent to Dr. Rosita KeaMenz for review.

## 2015-09-04 LAB — URINE CULTURE: SPECIAL REQUESTS: NORMAL

## 2015-09-05 ENCOUNTER — Inpatient Hospital Stay: Payer: BLUE CROSS/BLUE SHIELD | Admitting: Certified Registered Nurse Anesthetist

## 2015-09-05 ENCOUNTER — Inpatient Hospital Stay: Payer: BLUE CROSS/BLUE SHIELD

## 2015-09-05 ENCOUNTER — Inpatient Hospital Stay
Admission: RE | Admit: 2015-09-05 | Discharge: 2015-09-08 | DRG: 470 | Disposition: A | Discharge status: Home Health | Payer: BLUE CROSS/BLUE SHIELD | Source: Ambulatory Visit | Attending: Orthopedic Surgery | Admitting: Orthopedic Surgery

## 2015-09-05 ENCOUNTER — Encounter: Payer: Self-pay | Admitting: *Deleted

## 2015-09-05 ENCOUNTER — Encounter
Admission: RE | Disposition: A | Discharge status: Home Health | Payer: Self-pay | Source: Ambulatory Visit | Attending: Orthopedic Surgery

## 2015-09-05 DIAGNOSIS — M87852 Other osteonecrosis, left femur: Secondary | ICD-10-CM | POA: Diagnosis present

## 2015-09-05 DIAGNOSIS — Z7982 Long term (current) use of aspirin: Secondary | ICD-10-CM | POA: Diagnosis not present

## 2015-09-05 DIAGNOSIS — G8918 Other acute postprocedural pain: Secondary | ICD-10-CM

## 2015-09-05 DIAGNOSIS — M1612 Unilateral primary osteoarthritis, left hip: Secondary | ICD-10-CM | POA: Diagnosis present

## 2015-09-05 DIAGNOSIS — Z79899 Other long term (current) drug therapy: Secondary | ICD-10-CM | POA: Diagnosis not present

## 2015-09-05 DIAGNOSIS — K219 Gastro-esophageal reflux disease without esophagitis: Secondary | ICD-10-CM | POA: Diagnosis present

## 2015-09-05 DIAGNOSIS — I10 Essential (primary) hypertension: Secondary | ICD-10-CM | POA: Diagnosis present

## 2015-09-05 DIAGNOSIS — Z886 Allergy status to analgesic agent status: Secondary | ICD-10-CM

## 2015-09-05 DIAGNOSIS — F17200 Nicotine dependence, unspecified, uncomplicated: Secondary | ICD-10-CM | POA: Diagnosis present

## 2015-09-05 DIAGNOSIS — Z419 Encounter for procedure for purposes other than remedying health state, unspecified: Secondary | ICD-10-CM

## 2015-09-05 DIAGNOSIS — Z833 Family history of diabetes mellitus: Secondary | ICD-10-CM

## 2015-09-05 DIAGNOSIS — F329 Major depressive disorder, single episode, unspecified: Secondary | ICD-10-CM | POA: Diagnosis present

## 2015-09-05 DIAGNOSIS — Z803 Family history of malignant neoplasm of breast: Secondary | ICD-10-CM | POA: Diagnosis not present

## 2015-09-05 DIAGNOSIS — Z8249 Family history of ischemic heart disease and other diseases of the circulatory system: Secondary | ICD-10-CM | POA: Diagnosis not present

## 2015-09-05 DIAGNOSIS — Z885 Allergy status to narcotic agent status: Secondary | ICD-10-CM

## 2015-09-05 DIAGNOSIS — M87059 Idiopathic aseptic necrosis of unspecified femur: Secondary | ICD-10-CM | POA: Diagnosis present

## 2015-09-05 HISTORY — PX: TOTAL HIP ARTHROPLASTY: SHX124

## 2015-09-05 LAB — CREATININE, SERUM
CREATININE: 0.5 mg/dL (ref 0.44–1.00)
GFR calc Af Amer: >60 mL/min (ref >60)
GFR calc non Af Amer: >60 mL/min (ref >60)

## 2015-09-05 LAB — CBC
HCT: 34.6 % — ABNORMAL LOW (ref 35.0–47.0)
Hemoglobin: 11.7 g/dL — ABNORMAL LOW (ref 12.0–16.0)
MCH: 30.7 pg (ref 26.0–34.0)
MCHC: 33.8 g/dL (ref 32.0–36.0)
MCV: 90.7 fL (ref 80.0–100.0)
PLATELETS: 323 10*3/uL (ref 150–440)
RBC: 3.82 MIL/uL (ref 3.80–5.20)
RDW: 12.9 % (ref 11.5–14.5)
WBC: 11.4 10*3/uL — ABNORMAL HIGH (ref 3.6–11.0)

## 2015-09-05 SURGERY — ARTHROPLASTY, HIP, TOTAL, ANTERIOR APPROACH
Anesthesia: Spinal | Site: Hip | Laterality: Left | Wound class: Clean

## 2015-09-05 MED ORDER — MENTHOL 3 MG MT LOZG
1.0000 | LOZENGE | OROMUCOSAL | Status: DC | PRN
  Filled 2015-09-05: qty 9

## 2015-09-05 MED ORDER — PHENOL 1.4 % MT LIQD
1.0000 | OROMUCOSAL | Status: DC | PRN
  Filled 2015-09-05: qty 177

## 2015-09-05 MED ORDER — CEFAZOLIN SODIUM-DEXTROSE 2-4 GM/100ML-% IV SOLN
INTRAVENOUS | Status: AC
  Filled 2015-09-05: qty 100

## 2015-09-05 MED ORDER — METOPROLOL SUCCINATE ER 50 MG PO TB24
100.0000 mg | ORAL_TABLET | Freq: Every day | ORAL | Status: DC
  Administered 2015-09-06 – 2015-09-08 (×3): 100 mg via ORAL
  Filled 2015-09-05 (×3): qty 2

## 2015-09-05 MED ORDER — METOCLOPRAMIDE HCL 5 MG/ML IJ SOLN: 5.0000 mg | Freq: Three times a day (TID) | INTRAMUSCULAR | Status: DC | PRN

## 2015-09-05 MED ORDER — NEOMYCIN-POLYMYXIN B GU 40-200000 IR SOLN
Status: DC | PRN
  Administered 2015-09-05: 4 mL

## 2015-09-05 MED ORDER — ASPIRIN EC 81 MG PO TBEC
81.0000 mg | DELAYED_RELEASE_TABLET | Freq: Every day | ORAL | Status: DC
  Administered 2015-09-06 – 2015-09-08 (×3): 81 mg via ORAL
  Filled 2015-09-05 (×3): qty 1

## 2015-09-05 MED ORDER — BUPIVACAINE-EPINEPHRINE 0.25% -1:200000 IJ SOLN
INTRAMUSCULAR | Status: DC | PRN
  Administered 2015-09-05: 30 mL

## 2015-09-05 MED ORDER — METHOCARBAMOL 500 MG PO TABS
500.0000 mg | ORAL_TABLET | Freq: Four times a day (QID) | ORAL | Status: DC | PRN
  Administered 2015-09-05 – 2015-09-06 (×3): 500 mg via ORAL
  Filled 2015-09-05 (×3): qty 1

## 2015-09-05 MED ORDER — ONDANSETRON HCL 4 MG/2ML IJ SOLN
INTRAMUSCULAR | Status: DC | PRN
  Administered 2015-09-05: 4 mg via INTRAVENOUS

## 2015-09-05 MED ORDER — MIDAZOLAM HCL 5 MG/5ML IJ SOLN
INTRAMUSCULAR | Status: DC | PRN
  Administered 2015-09-05 (×3): 1 mg via INTRAVENOUS

## 2015-09-05 MED ORDER — FENTANYL CITRATE (PF) 100 MCG/2ML IJ SOLN: 25.0000 ug | INTRAMUSCULAR | Status: DC | PRN

## 2015-09-05 MED ORDER — PROPOFOL 10 MG/ML IV BOLUS
INTRAVENOUS | Status: DC | PRN
  Administered 2015-09-05: 20 mg via INTRAVENOUS

## 2015-09-05 MED ORDER — SODIUM CHLORIDE 0.9 % IV SOLN
INTRAVENOUS | Status: DC
Start: 2015-09-05 — End: 2015-09-08
  Administered 2015-09-05: 14:00:00 via INTRAVENOUS

## 2015-09-05 MED ORDER — ZOLPIDEM TARTRATE 5 MG PO TABS
5.0000 mg | ORAL_TABLET | Freq: Every evening | ORAL | Status: DC | PRN
  Administered 2015-09-05 – 2015-09-07 (×3): 5 mg via ORAL
  Filled 2015-09-05 (×3): qty 1

## 2015-09-05 MED ORDER — FENTANYL CITRATE (PF) 100 MCG/2ML IJ SOLN
INTRAMUSCULAR | Status: DC | PRN
  Administered 2015-09-05 (×2): 50 ug via INTRAVENOUS

## 2015-09-05 MED ORDER — ADULT MULTIVITAMIN W/MINERALS CH
1.0000 | ORAL_TABLET | Freq: Every day | ORAL | Status: DC
  Administered 2015-09-06 – 2015-09-08 (×3): 1 via ORAL
  Filled 2015-09-05 (×6): qty 1

## 2015-09-05 MED ORDER — METOCLOPRAMIDE HCL 10 MG PO TABS: 5.0000 mg | ORAL_TABLET | Freq: Three times a day (TID) | ORAL | Status: DC | PRN

## 2015-09-05 MED ORDER — FAMOTIDINE 20 MG PO TABS
ORAL_TABLET | ORAL | Status: AC
  Administered 2015-09-05: 20 mg via ORAL
  Filled 2015-09-05: qty 1

## 2015-09-05 MED ORDER — NEOMYCIN-POLYMYXIN B GU 40-200000 IR SOLN
Status: AC
  Filled 2015-09-05: qty 4

## 2015-09-05 MED ORDER — DOCUSATE SODIUM 100 MG PO CAPS
100.0000 mg | ORAL_CAPSULE | Freq: Two times a day (BID) | ORAL | Status: DC
  Administered 2015-09-05 – 2015-09-08 (×7): 100 mg via ORAL
  Filled 2015-09-05 (×7): qty 1

## 2015-09-05 MED ORDER — ACETAMINOPHEN 650 MG RE SUPP: 650.0000 mg | Freq: Four times a day (QID) | RECTAL | Status: DC | PRN

## 2015-09-05 MED ORDER — ONDANSETRON HCL 4 MG/2ML IJ SOLN: 4.0000 mg | Freq: Four times a day (QID) | INTRAMUSCULAR | Status: DC | PRN

## 2015-09-05 MED ORDER — PROPOFOL 500 MG/50ML IV EMUL
INTRAVENOUS | Status: DC | PRN
  Administered 2015-09-05: 11:00:00 via INTRAVENOUS
  Administered 2015-09-05: 75 ug/kg/min via INTRAVENOUS

## 2015-09-05 MED ORDER — ENOXAPARIN SODIUM 40 MG/0.4ML ~~LOC~~ SOLN
40.0000 mg | SUBCUTANEOUS | Status: DC
  Administered 2015-09-06 – 2015-09-08 (×3): 40 mg via SUBCUTANEOUS
  Filled 2015-09-05 (×3): qty 0.4

## 2015-09-05 MED ORDER — CEFAZOLIN SODIUM-DEXTROSE 2-4 GM/100ML-% IV SOLN
2.0000 g | Freq: Once | INTRAVENOUS | Status: AC
  Administered 2015-09-05: 2 g via INTRAVENOUS

## 2015-09-05 MED ORDER — ONDANSETRON HCL 4 MG PO TABS: 4.0000 mg | ORAL_TABLET | Freq: Four times a day (QID) | ORAL | Status: DC | PRN

## 2015-09-05 MED ORDER — HYDROCHLOROTHIAZIDE 12.5 MG PO CAPS
12.5000 mg | ORAL_CAPSULE | Freq: Every day | ORAL | Status: DC
  Administered 2015-09-06 – 2015-09-08 (×3): 12.5 mg via ORAL
  Filled 2015-09-05 (×3): qty 1

## 2015-09-05 MED ORDER — DIPHENHYDRAMINE HCL 12.5 MG/5ML PO ELIX: 12.5000 mg | ORAL_SOLUTION | ORAL | Status: DC | PRN

## 2015-09-05 MED ORDER — LISINOPRIL 10 MG PO TABS
10.0000 mg | ORAL_TABLET | Freq: Every day | ORAL | Status: DC
  Administered 2015-09-06 – 2015-09-08 (×3): 10 mg via ORAL
  Filled 2015-09-05 (×3): qty 1

## 2015-09-05 MED ORDER — TRANEXAMIC ACID 1000 MG/10ML IV SOLN
1000.0000 mg | INTRAVENOUS | Status: AC
  Administered 2015-09-05: 1000 mg via INTRAVENOUS
  Filled 2015-09-05: qty 10

## 2015-09-05 MED ORDER — VENLAFAXINE HCL 37.5 MG PO TABS
75.0000 mg | ORAL_TABLET | Freq: Every day | ORAL | Status: DC
  Administered 2015-09-06 – 2015-09-08 (×3): 75 mg via ORAL
  Filled 2015-09-05 (×3): qty 2

## 2015-09-05 MED ORDER — KETAMINE HCL 50 MG/ML IJ SOLN
INTRAMUSCULAR | Status: DC | PRN
Start: 2015-09-05 — End: 2015-09-05
  Administered 2015-09-05 (×2): 50 mg via INTRAMUSCULAR

## 2015-09-05 MED ORDER — ACETAMINOPHEN 10 MG/ML IV SOLN
INTRAVENOUS | Status: AC
  Filled 2015-09-05: qty 100

## 2015-09-05 MED ORDER — HYDROCODONE-ACETAMINOPHEN 5-325 MG PO TABS
1.0000 | ORAL_TABLET | ORAL | Status: DC | PRN
  Administered 2015-09-05 – 2015-09-08 (×9): 1 via ORAL
  Filled 2015-09-05 (×9): qty 1

## 2015-09-05 MED ORDER — FAMOTIDINE 20 MG PO TABS
20.0000 mg | ORAL_TABLET | Freq: Once | ORAL | Status: AC
  Administered 2015-09-05: 20 mg via ORAL

## 2015-09-05 MED ORDER — ACETAMINOPHEN 325 MG PO TABS
650.0000 mg | ORAL_TABLET | Freq: Four times a day (QID) | ORAL | Status: DC | PRN
  Administered 2015-09-06: 650 mg via ORAL
  Administered 2015-09-07: 325 mg via ORAL
  Administered 2015-09-07: 650 mg via ORAL
  Administered 2015-09-07: 325 mg via ORAL
  Administered 2015-09-08: 650 mg via ORAL
  Administered 2015-09-08: 325 mg via ORAL
  Filled 2015-09-05 (×2): qty 2
  Filled 2015-09-05 (×3): qty 1
  Filled 2015-09-05: qty 2

## 2015-09-05 MED ORDER — ONDANSETRON HCL 4 MG/2ML IJ SOLN: 4.0000 mg | Freq: Once | INTRAMUSCULAR | Status: DC | PRN

## 2015-09-05 MED ORDER — METHOCARBAMOL 1000 MG/10ML IJ SOLN
500.0000 mg | Freq: Four times a day (QID) | INTRAVENOUS | Status: DC | PRN
  Filled 2015-09-05: qty 5

## 2015-09-05 MED ORDER — BUPIVACAINE-EPINEPHRINE (PF) 0.25% -1:200000 IJ SOLN
INTRAMUSCULAR | Status: AC
  Filled 2015-09-05: qty 30

## 2015-09-05 MED ORDER — PHENYLEPHRINE HCL 10 MG/ML IJ SOLN
INTRAMUSCULAR | Status: DC | PRN
  Administered 2015-09-05 (×3): 100 ug via INTRAVENOUS
  Administered 2015-09-05 (×2): 200 ug via INTRAVENOUS
  Administered 2015-09-05 (×2): 100 ug via INTRAVENOUS

## 2015-09-05 MED ORDER — ACETAMINOPHEN 10 MG/ML IV SOLN
INTRAVENOUS | Status: DC | PRN
  Administered 2015-09-05: 1000 mg via INTRAVENOUS

## 2015-09-05 MED ORDER — LACTATED RINGERS IV SOLN
INTRAVENOUS | Status: DC
  Administered 2015-09-05: 09:00:00 via INTRAVENOUS

## 2015-09-05 MED ORDER — MORPHINE SULFATE (PF) 2 MG/ML IV SOLN
2.0000 mg | INTRAVENOUS | Status: DC | PRN
  Administered 2015-09-05 – 2015-09-06 (×4): 2 mg via INTRAVENOUS
  Filled 2015-09-05 (×4): qty 1

## 2015-09-05 MED ORDER — LISINOPRIL-HYDROCHLOROTHIAZIDE 10-12.5 MG PO TABS: 1.0000 | ORAL_TABLET | Freq: Every day | ORAL | Status: DC

## 2015-09-05 MED ORDER — CEFAZOLIN SODIUM-DEXTROSE 2-4 GM/100ML-% IV SOLN
2.0000 g | Freq: Four times a day (QID) | INTRAVENOUS | Status: AC
  Administered 2015-09-05 – 2015-09-06 (×2): 2 g via INTRAVENOUS
  Filled 2015-09-05 (×2): qty 100

## 2015-09-05 SURGICAL SUPPLY — 45 items
BLADE SAW SAG 18.5X105 (BLADE) ×3 IMPLANT
BNDG COHESIVE 6X5 TAN STRL LF (GAUZE/BANDAGES/DRESSINGS) ×6 IMPLANT
CANISTER SUCT 1200ML W/VALVE (MISCELLANEOUS) ×3 IMPLANT
CAPT HIP TOTAL 3 ×3 IMPLANT
CATH FOL LEG HOLDER (MISCELLANEOUS) ×3 IMPLANT
CATH TRAY METER 16FR LF (MISCELLANEOUS) ×3 IMPLANT
CHLORAPREP W/TINT 26ML (MISCELLANEOUS) ×3 IMPLANT
DECANTER SPIKE VIAL GLASS SM (MISCELLANEOUS) ×3 IMPLANT
DRAPE C-ARM XRAY 36X54 (DRAPES) ×3 IMPLANT
DRAPE INCISE IOBAN 66X60 STRL (DRAPES) IMPLANT
DRAPE POUCH INSTRU U-SHP 10X18 (DRAPES) ×3 IMPLANT
DRAPE SHEET LG 3/4 BI-LAMINATE (DRAPES) ×9 IMPLANT
DRAPE STERI IOBAN 125X83 (DRAPES) ×3 IMPLANT
DRAPE TABLE BACK 80X90 (DRAPES) ×3 IMPLANT
DRSG OPSITE POSTOP 4X8 (GAUZE/BANDAGES/DRESSINGS) ×6 IMPLANT
ELECT BLADE 6.5 EXT (BLADE) ×3 IMPLANT
GAUZE SPONGE 4X4 12PLY STRL (GAUZE/BANDAGES/DRESSINGS) ×3 IMPLANT
GLOVE BIOGEL PI IND STRL 9 (GLOVE) ×1 IMPLANT
GLOVE BIOGEL PI INDICATOR 9 (GLOVE) ×2
GLOVE SURG ORTHO 9.0 STRL STRW (GLOVE) ×6 IMPLANT
GOWN STRL REUS W/ TWL LRG LVL3 (GOWN DISPOSABLE) ×1 IMPLANT
GOWN STRL REUS W/TWL LRG LVL3 (GOWN DISPOSABLE) ×2
GOWN SURG XXL (GOWNS) ×3 IMPLANT
HEMOVAC 400CC 10FR (MISCELLANEOUS) ×3 IMPLANT
HOOD PEEL AWAY FLYTE STAYCOOL (MISCELLANEOUS) ×6 IMPLANT
MAT BLUE FLOOR 46X72 FLO (MISCELLANEOUS) ×3 IMPLANT
NDL SAFETY 18GX1.5 (NEEDLE) ×3 IMPLANT
NEEDLE SPNL 18GX3.5 QUINCKE PK (NEEDLE) ×3 IMPLANT
NS IRRIG 1000ML POUR BTL (IV SOLUTION) ×3 IMPLANT
PACK HIP COMPR (MISCELLANEOUS) ×3 IMPLANT
SOL PREP PVP 2OZ (MISCELLANEOUS) ×3
SOLUTION PREP PVP 2OZ (MISCELLANEOUS) ×1 IMPLANT
STAPLER SKIN PROX 35W (STAPLE) ×3 IMPLANT
STRAP SAFETY BODY (MISCELLANEOUS) ×3 IMPLANT
SUT DVC 2 QUILL PDO  T11 36X36 (SUTURE) ×2
SUT DVC 2 QUILL PDO T11 36X36 (SUTURE) ×1 IMPLANT
SUT DVC QUILL MONODERM 30X30 (SUTURE) ×3 IMPLANT
SUT SILK 0 (SUTURE) ×2
SUT SILK 0 30XBRD TIE 6 (SUTURE) ×1 IMPLANT
SUT VIC AB 1 CT1 36 (SUTURE) ×3 IMPLANT
SYR 20CC LL (SYRINGE) ×3 IMPLANT
SYR 30ML LL (SYRINGE) ×3 IMPLANT
TAPE MICROFOAM 4IN (TAPE) ×3 IMPLANT
TOWEL OR 17X26 4PK STRL BLUE (TOWEL DISPOSABLE) ×3 IMPLANT
TUBE KAMVAC SUCTION (TUBING) ×3 IMPLANT

## 2015-09-05 NOTE — H&P (Signed)
Reviewed paper H+P, will be scanned into chart. No changes noted.  

## 2015-09-05 NOTE — Anesthesia Preprocedure Evaluation (Signed)
Anesthesia Evaluation  Patient identified by MRN, date of birth, ID band Patient awake    Reviewed: Allergy & Precautions, NPO status , Patient's Chart, lab work & pertinent test results, reviewed documented beta blocker date and time   Airway Mallampati: II  TM Distance: >3 FB     Dental  (+) Chipped   Pulmonary Current Smoker,           Cardiovascular hypertension, Pt. on medications and Pt. on home beta blockers      Neuro/Psych PSYCHIATRIC DISORDERS Depression    GI/Hepatic GERD  Controlled,  Endo/Other    Renal/GU      Musculoskeletal  (+) Arthritis ,   Abdominal   Peds  Hematology   Anesthesia Other Findings   Reproductive/Obstetrics                             Anesthesia Physical Anesthesia Plan  ASA: III  Anesthesia Plan: Spinal   Post-op Pain Management:    Induction:   Airway Management Planned:   Additional Equipment:   Intra-op Plan:   Post-operative Plan:   Informed Consent: I have reviewed the patients History and Physical, chart, labs and discussed the procedure including the risks, benefits and alternatives for the proposed anesthesia with the patient or authorized representative who has indicated his/her understanding and acceptance.     Plan Discussed with: CRNA  Anesthesia Plan Comments:         Anesthesia Quick Evaluation

## 2015-09-05 NOTE — [place] FL2 (Signed)
[place] MEDICA[place] FL2 LEVEL OF CARE SCREENING TOOL     IDENTIFICATION  Patient Name: [name] [name] Birthdate: 02/24/1961 Sex: female Admission Date (Current Location): 09/05/2015  [place]   Facility and Address:  [facility], [address], [place], [place] [address]      Provider Number: [id number]  Attending Physician Name and Address:  Kennedy BuckerMichael Menz, MD  Relative Name and Phone Number:       Current Level of Care: Hospital Recommended Level of Care: Skilled Nursing Facility Prior Approval Number:    Date Approved/Denied:   PASRR Number:  [id number])  Discharge Plan: SNF    Current Diagnoses: Patient Active Problem List   Diagnosis Date Noted  . Avascular necrosis of hip (HCC) 09/05/2015  . Open wound of right great toe without damage to nail 02/18/2015   Chronic pain of left knee 01/28/2015  Last Assessment & Plan:   Failed mobic. PT ordered 9/16   Chronic midline low back pain without sciatica 01/28/2015  Overview:   Failed mobic. PT ordered 9/16   Acquired claw toe of left foot 07/09/2014  Acquired metatarsus adductus of left foot 07/09/2014  Closed displaced fracture of fifth metatarsal bone of left foot with malunion 07/05/2014  Right foot drop 10/06/2012  Depression, unspecified 01/13/2011  Essential hypertension, benign      Orientation RESPIRATION BLADDER Height & Weight     Self, Time, Situation, Place  Normal Continent Weight: 183 lb (83.008 kg) Height:  5\' 7"  (170.2 cm)  BEHAVIORAL SYMPTOMS/MOOD NEUROLOGICAL BOWEL NUTRITION STATUS   (none )  (none ) Continent Diet (Diet: Clear Liquid )  AMBULATORY STATUS COMMUNICATION OF NEEDS Skin   Extensive Assist Verbally Surgical wounds (Incision: Left Hip. )                       Personal Care Assistance Level of Assistance  Bathing, Feeding, Dressing Bathing Assistance: Limited assistance Feeding assistance: Independent Dressing  Assistance: Limited assistance     Functional Limitations Info  Sight, Hearing, Speech Sight Info: Adequate Hearing Info: Adequate Speech Info: Adequate    SPECIAL CARE FACTORS FREQUENCY  PT (By licensed PT), OT (By licensed OT)     PT Frequency:  (5) OT Frequency:  (5)            Contractures      Additional Factors Info  Code Status, Allergies Code Status Info:  (Not on File ) Allergies Info:  (Aleve, Oxycodone. )           Current Medications (09/05/2015):  This is the current hospital active medication list Current Facility-Administered Medications  Medication Dose Route Frequency Provider Last Rate Last Dose  . 0.9 %  sodium chloride infusion   Intravenous Continuous Kennedy BuckerMichael Menz, MD      . acetaminophen (TYLENOL) tablet 650 mg  650 mg Oral Q6H PRN Kennedy BuckerMichael Menz, MD       Or  . acetaminophen (TYLENOL) suppository 650 mg  650 mg Rectal Q6H PRN Kennedy BuckerMichael Menz, MD      . aspirin EC tablet 81 mg  81 mg Oral Daily Kennedy BuckerMichael Menz, MD      . ceFAZolin (ANCEF) 2-4 GM/100ML-% IVPB           . ceFAZolin (ANCEF) IVPB 2g/100 mL premix  2 g Intravenous Q6H Kennedy BuckerMichael Menz, MD      . diphenhydrAMINE (BENADRYL) 12.5 MG/5ML elixir 12.5-25 mg  12.5-25 mg Oral Q4H PRN Casimiro NeedleMichael  Rosita Kea, MD      . docusate sodium (COLACE) capsule 100 mg  100 mg Oral BID Kennedy Bucker, MD      . Melene Muller ON 09/06/2015] enoxaparin (LOVENOX) injection 40 mg  40 mg Subcutaneous Q24H Kennedy Bucker, MD      . lisinopril (PRINIVIL,ZESTRIL) tablet 10 mg  10 mg Oral Daily Kennedy Bucker, MD       And  . hydrochlorothiazide (MICROZIDE) capsule 12.5 mg  12.5 mg Oral Daily Kennedy Bucker, MD      . HYDROcodone-acetaminophen (NORCO/VICODIN) 5-325 MG per tablet 1-2 tablet  1-2 tablet Oral Q4H PRN Kennedy Bucker, MD      . menthol-cetylpyridinium (CEPACOL) lozenge 3 mg  1 lozenge Oral PRN Kennedy Bucker, MD       Or  . phenol (CHLORASEPTIC) mouth spray 1 spray  1 spray Mouth/Throat PRN Kennedy Bucker, MD      . methocarbamol (ROBAXIN)  tablet 500 mg  500 mg Oral Q6H PRN Kennedy Bucker, MD       Or  . methocarbamol (ROBAXIN) 500 mg in dextrose 5 % 50 mL IVPB  500 mg Intravenous Q6H PRN Kennedy Bucker, MD      . metoCLOPramide (REGLAN) tablet 5-10 mg  5-10 mg Oral Q8H PRN Kennedy Bucker, MD       Or  . metoCLOPramide (REGLAN) injection 5-10 mg  5-10 mg Intravenous Q8H PRN Kennedy Bucker, MD      . Melene Muller ON 09/06/2015] metoprolol succinate (TOPROL-XL) 24 hr tablet 100 mg  100 mg Oral Daily Kennedy Bucker, MD      . morphine 2 MG/ML injection 2 mg  2 mg Intravenous Q1H PRN Kennedy Bucker, MD      . multivitamin with minerals tablet 1 tablet  1 tablet Oral Daily Kennedy Bucker, MD      . ondansetron [facility]) tablet 4 mg  4 mg Oral Q6H PRN Kennedy Bucker, MD       Or  . ondansetron [facility]) injection 4 mg  4 mg Intravenous Q6H PRN Kennedy Bucker, MD      . venlafaxine [facility]) tablet 75 mg  75 mg Oral Daily Kennedy Bucker, MD      . zolpidem [facility]) tablet 5 mg  5 mg Oral QHS PRN Kennedy Bucker, MD         Discharge Medications: Please see discharge summary for a list of discharge medications.  Relevant Imaging Results:  Relevant Lab Results:   Additional Information  (SSN: [id number])  Haig Prophet, LCSW

## 2015-09-05 NOTE — Anesthesia Procedure Notes (Addendum)
Date/Time: 09/05/2015 10:19 AM Performed by: Ginger CarneMICHELET, STEPHANIE Pre-anesthesia Checklist: Patient identified, Emergency Drugs available, Suction available, Patient being monitored and Timeout performed Patient Re-evaluated:Patient Re-evaluated prior to inductionOxygen Delivery Method: Simple face mask   Spinal Patient location during procedure: OR Staffing Anesthesiologist: Berdine AddisonHOMAS, Ghalia Reicks Performed by: anesthesiologist  Preanesthetic Checklist Completed: patient identified, site marked, surgical consent, pre-op evaluation, timeout performed, IV checked and risks and benefits discussed Spinal Block Patient position: sitting Prep: Betadine Patient monitoring: heart rate, cardiac monitor, continuous pulse ox and blood pressure Approach: midline Location: L3-4 Injection technique: single-shot Needle Needle type: Pencil-Tip  Needle gauge: 25 G Needle length: 9 cm Assessment Sensory level: T10 Additional Notes 1017 marcaine 12.5mg .

## 2015-09-05 NOTE — Op Note (Signed)
09/05/2015  12:21 PM  PATIENT:  [name] [name]  55 y.o. female  PRE-OPERATIVE DIAGNOSIS:  left hip -AVASCULAR NECROSIS  POST-OPERATIVE DIAGNOSIS:  Left hip avascular necrosis  PROCEDURE:  Procedure(s): TOTAL HIP ARTHROPLASTY ANTERIOR APPROACH (Left)  SURGEON: Leitha SchullerMichael J Othell Jaime, MD  ASSISTANTS: None  ANESTHESIA:   spinal  EBL:  Total I/O In: 1025 [I.V.:1025] Out: 1200 [Urine:900; Blood:300]  BLOOD ADMINISTERED:none  DRAINS: (2) Hemovact drain(s) in the Subcutaneous layer with  Suction Open   LOCAL MEDICATIONS USED:  MARCAINE     SPECIMEN:  Source of Specimen:  Left femoral head  DISPOSITION OF SPECIMEN:  PATHOLOGY  COUNTS:  YES  TOURNIQUET:  * No tourniquets in log *  IMPLANTS: Medacta AMIS 2 standard collared stem with a 52 mm Mpact DM cup with liner and S 28 mm hed  DICTATION: .Dragon Dictation   The patient was brought to the operating room and after spinal anesthesia was obtained patient was placed on the operative table with the ipsilateral foot into the Medacta attachment, contralateral leg on a well-padded table. C-arm was brought in and preop template x-ray taken. After prepping and draping in usual sterile fashion appropriate patient identification and timeout procedures were completed. Anterior approach to the hip was obtained and centered over the greater trochanter and TFL muscle. The subcutaneous tissue was incised hemostasis being achieved by electrocautery. TFL fascia was incised and the muscle retracted laterally deep retractor placed. The lateral femoral circumflex vessels were identified and ligated. The anterior capsule was exposed and a capsulotomy performed. The neck was identified and a femoral neck cut carried out with a saw. The head was removed without difficulty and showed sclerotic femoral head and acetabulum. Reaming was carried out to 50 mm and a 52 mm cup trial gave appropriate tightness to the acetabular component a 52 mm Mpact DM cup was impacted  into position. The leg was then externally rotated and ischiofemoral and pubofemoral releases carried out. The femur was sequentially broached to a size 2, size 2 standard stem with S head trials were placed and the final components chosen. The 2 standard stem was inserted along with a S 28 mm head and 52 mm liner. The hip was reduced and was stable the wound was thoroughly irrigated with a dilute Betadine solution. The deep fascia was closed a heavy Quill after infiltration of 30 cc of quarter percent Sensorcaine with epinephrine. Subcutaneous drains were then inserted. 2-0 Quill to close the skin with skin staples Xeroform and honeycomb dressing applied  PLAN OF CARE: Admit to inpatient

## 2015-09-05 NOTE — Plan of Care (Signed)
Problem: Activity: Goal: Ability to avoid complications of mobility impairment will improve Outcome: Progressing Patient is cooperative with movement in bed and physical therapy. Goal: Will remain free from falls Outcome: Progressing Patient remains free from falls and calls for assistance.

## 2015-09-05 NOTE — Evaluation (Addendum)
Physical Therapy Evaluation Patient Details Name: [name] [name] MRN: [id number] DOB: 10-20-1960 Today's Date: 09/05/2015   History of Present Illness  Pt admitted for L THR on 09/05/2015.   Clinical Impression  Pt is a pleasant 55 year old female who was admitted for L THR. Pt performs bed mobility with min assist and transfers/ambulation with cga and rw. Pt demonstrates deficits with strength/pain/mobility. Pt with increased muscle spasms during movement of R LE. Pt very motivated to perform therapy. Daughter reports she has 3 wheeled RW and can bring to hospital for practice. Pt lives with roommate, however she is now in the hospital. Daughter reports she can provide assistance in home post discharge. Would benefit from skilled PT to address above deficits and promote optimal return to PLOF. Recommend transition to HHPT upon discharge from acute hospitalization.       Follow Up Recommendations Home health PT    Equipment Recommendations       Recommendations for Other Services       Precautions / Restrictions Precautions Precautions: Fall;Anterior Hip Precaution Booklet Issued: No Restrictions Weight Bearing Restrictions: Yes LLE Weight Bearing: Weight bearing as tolerated      Mobility  Bed Mobility Overal bed mobility: Needs Assistance Bed Mobility: Supine to Sit     Supine to sit: Min assist     General bed mobility comments: assist for sequencing and scooting to sit at EOB. Once at EOB, pt able to sit with supervision. Safe technique performed  Transfers Overall transfer level: Needs assistance Equipment used: Rolling walker (2 wheeled) Transfers: Sit to/from Stand Sit to Stand: Min guard         General transfer comment: safe technique performed. Pt able to achieve upright posture. Pt educated on correct WB status prior to standing.  Ambulation/Gait Ambulation/Gait assistance: Min guard Ambulation Distance (Feet): 3 Feet Assistive device: Rolling walker  (2 wheeled) Gait Pattern/deviations: Step-to pattern     General Gait Details: cues for step to gait pattern. Safe technique performed with rw. Upright posture noted  Stairs            Wheelchair Mobility    Modified Rankin (Stroke Patients Only)       Balance Overall balance assessment: Needs assistance Sitting-balance support: Feet supported Sitting balance-Leahy Scale: Normal     Standing balance support: Bilateral upper extremity supported Standing balance-Leahy Scale: Good                               Pertinent Vitals/Pain Pain Assessment: 0-10 Pain Score: 7  Pain Location: L LE Pain Descriptors / Indicators: Operative site guarding Pain Intervention(s): Limited activity within patient's tolerance;RN gave pain meds during session    Home Living Family/patient expects to be discharged to:: Private residence Living Arrangements: Other (Comment) (lives with roommate) Available Help at Discharge: Family (daughter is planning to stay with her) Type of Home: House Home Access: Level entry     Home Layout: One level Home Equipment:  (tri rw)      Prior Function Level of Independence: Independent with assistive device(s) (has been using rw lately)               Hand Dominance        Extremity/Trunk Assessment   Upper Extremity Assessment: Overall WFL for tasks assessed           Lower Extremity Assessment: Generalized weakness (L LE grossly 3/5 and cramps with movement)  Communication   Communication: No difficulties  Cognition Arousal/Alertness: Awake/alert Behavior During Therapy: WFL for tasks assessed/performed Overall Cognitive Status: Within Functional Limits for tasks assessed                      General Comments      Exercises Other Exercises Other Exercises: Pt performed seated ther-ex on L LE including LAQ, ankle pumps, SLRs, and quad sets with mod assist for SLRs and supervision for other  ther-ex. All ther-ex performed x 10 reps with cues and correct technique      Assessment/Plan    PT Assessment Patient needs continued PT services  PT Diagnosis Difficulty walking;Generalized weakness;Acute pain   PT Problem List Decreased range of motion;Decreased mobility;Pain  PT Treatment Interventions Gait training;Therapeutic exercise   PT Goals (Current goals can be found in the Care Plan section) Acute Rehab PT Goals Patient Stated Goal: to go home PT Goal Formulation: With patient Time For Goal Achievement: 09/19/15 Potential to Achieve Goals: Good    Frequency BID   Barriers to discharge        Co-evaluation               End of Session Equipment Utilized During Treatment: Gait belt Activity Tolerance: Patient tolerated treatment well Patient left: in chair;with chair alarm set Nurse Communication: Mobility status         Time: 6962-95281620-1638 PT Time Calculation (min) (ACUTE ONLY): 18 min   Charges:   PT Evaluation $PT Eval Moderate Complexity: 1 Procedure PT Treatments $Therapeutic Exercise: 8-22 mins   PT G Codes:        [name] [name] 09/05/2015, 4:55 PM Elizabeth PalauStephanie Milana Salay, PT, DPT [phone]

## 2015-09-05 NOTE — Transfer of Care (Signed)
Immediate Anesthesia Transfer of Care Note  Patient: [name] [name]  Procedure(s) Performed: Procedure(s): TOTAL HIP ARTHROPLASTY ANTERIOR APPROACH (Left)  Patient Location: PACU  Anesthesia Type:Spinal  Level of Consciousness: awake, alert  and oriented  Airway & Oxygen Therapy: Patient Spontanous Breathing  Post-op Assessment: Report given to RN and Post -op Vital signs reviewed and stable  Post vital signs: Reviewed and stable  Last Vitals:  Filed Vitals:   09/05/15 0831  BP: 135/79  Pulse: 97  Temp: 36.5 C  Resp: 16    Last Pain:  Filed Vitals:   09/05/15 0833  PainSc: 5          Complications: No apparent anesthesia complications

## 2015-09-06 NOTE — Evaluation (Signed)
Occupational Therapy Evaluation Patient Details Name: [name] [name] MRN: [id number] DOB: 1960-08-23 Today's Date: 09/06/2015    History of Present Illness Pt admitted for L THR on 09/05/2015.    Clinical Impression   Pt. Is a 55 y.o. Female who was admitted to [facility] with a LTHR. Pt. Presents with pain, decreased ROM, decreased activity tolerance, and weakness which hinders her ability to complete ADL tasks. Pt. Could benefit from skilled OT services to review A/E use, and work on improving functional mobility for ADLs. Pt. Plans to return home upon discharge.     Follow Up Recommendations  Home health OT    Equipment Recommendations  Tub/shower seat    Recommendations for Other Services       Precautions / Restrictions Precautions Precautions: Fall;Anterior Hip Precaution Booklet Issued: Yes (comment) Restrictions Weight Bearing Restrictions: Yes LLE Weight Bearing: Weight bearing as tolerated      Mobility Bed Mobility    Balance     Sitting balance-Leahy Scale: Normal                                      ADL Overall ADL's : Needs assistance/impaired                                       General ADL Comments: Pt. requires modA dressing with A/E use. Pt. education was provided about A/E use  for LE ADLs.     Vision     Perception     Praxis      Pertinent Vitals/Pain Pain Assessment: 0-10 Pain Score: 7  Pain Location: L LE Pain Descriptors / Indicators: Operative site guarding Pain Intervention(s): Limited activity within patient's tolerance;RN gave pain meds during session     Hand Dominance Right   Extremity/Trunk Assessment Upper Extremity Assessment Upper Extremity Assessment: Overall WFL for tasks assessed   Lower Extremity Assessment Lower Extremity Assessment: Generalized weakness       Communication Communication Communication: No difficulties   Cognition Arousal/Alertness: Awake/alert Behavior  During Therapy: WFL for tasks assessed/performed Overall Cognitive Status: Within Functional Limits for tasks assessed                     General Comments       Exercises   Shoulder Instructions      Home Living Family/patient expects to be discharged to:: Private residence Living Arrangements: Other (Comment) Available Help at Discharge: Family Type of Home: House Home Access: Level entry     Home Layout: One level     Bathroom Shower/Tub: Walk-in shower;Door   [employer] Toilet: Standard Bathroom Accessibility: Yes How Accessible: Accessible via walker Home Equipment: Bedside commode (3 wheeled walker with brakes.)          Prior Functioning/Environment Level of Independence: Independent with assistive device(s)        Comments: Pt. was driving, and working as a Production designer, theatre/television/film at [employer].    OT Diagnosis: Generalized weakness   OT Problem List: Decreased strength;Decreased range of motion   OT Treatment/Interventions:      OT Goals(Current goals can be found in the care plan section) Acute Rehab OT Goals Patient Stated Goal: to go home OT Goal Formulation: With patient Potential to Achieve Goals: Good  OT Frequency: Min 2X/week   Barriers to D/C:  Co-evaluation              End of Session    Activity Tolerance: Patient tolerated treatment well Patient left: in chair;with call bell/phone within reach;with chair alarm set   Time: 1610-96040900-0925 OT Time Calculation (min): 25 min Charges:  OT General Charges $OT Visit: 1 Procedure OT Evaluation $OT Eval Low Complexity: 1 Procedure G-Codes:    Olegario MessierElaine Syed Zukas, MS, OTR/L Olegario MessierElaine Dior Dominik 09/06/2015, 10:08 AM

## 2015-09-06 NOTE — Care Management Note (Signed)
Case Management Note  Patient Details  Name: Wess BottsCathy L Vanegas MRN: [id number] Date of Birth: 03/25/1961  Subjective/Objective:      54yo Ms Estanislado PandyCathy Renteria was admitted 09/05/15 for a planned surgical repair of a necrotic left hip by Dr Rosita KeaMenz same day. Her daughter will stay with her at home after surgery and she also livres with a roommate. PCP=Elizabeth White. Pharmacy= [facility] [place]. Lovenox 40mg  x 14 days was called in 09/06/15. Mrs Janit Paganberlin has a RW and a BSC at home. No home oxygen. She chose [facility] to be her home health provider. [facility] at [facility] was notified of this HH-PT referral. Daughter will provide transportation home. Case management will follow for discharge planning.                Action/Plan:   Expected Discharge Date:                  Expected Discharge Plan:     In-House Referral:     Discharge planning Services     Post Acute Care Choice:    Choice offered to:     DME Arranged:    DME Agency:     HH Arranged:    HH Agency:     Status of Service:     Medicare Important Message Given:    Date Medicare IM Given:    Medicare IM give by:    Date Additional Medicare IM Given:    Additional Medicare Important Message give by:     If discussed at Long Length of Stay Meetings, dates discussed:    Additional Comments:  Nell Gales A, RN 09/06/2015, 11:24 AM

## 2015-09-06 NOTE — Plan of Care (Signed)
Problem: Safety: Goal: Ability to remain free from injury will improve Outcome: Progressing No injury reported or observed   

## 2015-09-06 NOTE — Progress Notes (Signed)
Physical Therapy Treatment Patient Details Name: [name] [name] MRN: [id number] DOB: [birth year] Today's Date: 09/06/2015    History of Present Illness Pt admitted for L THR on 09/05/2015.     PT Comments    Pt in bed with c/o sleepiness but agrees to session.  Supine AAROM as described below.  To edge of bed with min a x 1 for LE management.  Stood with min guard x 1 with verbal cues for hand placements.  Transferred to bedside commode.  Ambulated 64' with rolling walker and min guard x 1.  Pt with self initiated rest breaks at times.  No lob's noted but unsafe to ambulate independently at this time due to balance and safety.  Occasional verbal cues for step pattern and walker placement.    Follow Up Recommendations  Home health PT     Equipment Recommendations       Recommendations for Other Services       Precautions / Restrictions Precautions Precautions: Fall;Anterior Hip Precaution Booklet Issued: Yes (comment) Restrictions Weight Bearing Restrictions: Yes LLE Weight Bearing: Weight bearing as tolerated    Mobility  Bed Mobility Overal bed mobility: Needs Assistance Bed Mobility: Supine to Sit;Sit to Supine     Supine to sit: Min assist Sit to supine: Min guard   General bed mobility comments: Pt required min assist and use of bed rails with bed mobility. Pt required assist with L LE and cues for hand placement. Pt required increased time to sit at EOB d/t pain.   Transfers Overall transfer level: Needs assistance Equipment used: Rolling walker (2 wheeled) Transfers: Sit to/from Stand Sit to Stand: Min assist         General transfer comment: Pt able to perform transfer from sit to stand using RW and min assist. Pt slow to rise and to maintain WB in L LE.   Ambulation/Gait Ambulation/Gait assistance: Min assist Ambulation Distance (Feet): 70 Feet Assistive device: Rolling walker (2 wheeled) Gait Pattern/deviations: Step-to pattern;Decreased stance time -  left Gait velocity: slow Gait velocity interpretation: <1.8 ft/sec, indicative of risk for recurrent falls General Gait Details: Less assistance this PM for gait and transfers, increased distance.   Stairs            Wheelchair Mobility    Modified Rankin (Stroke Patients Only)       Balance Overall balance assessment: Needs assistance Sitting-balance support: Feet supported Sitting balance-Leahy Scale: Fair     Standing balance support: Bilateral upper extremity supported Standing balance-Leahy Scale: Fair                      Cognition Arousal/Alertness: Awake/alert Behavior During Therapy: WFL for tasks assessed/performed Overall Cognitive Status: Within Functional Limits for tasks assessed                      Exercises General Exercises - Lower Extremity Ankle Circles/Pumps: AROM;Both;20 reps;Supine Quad Sets: AROM;Left;20 reps Gluteal Sets: AROM;Both;10 reps Short Arc QuadBarbaraann Boys;Left;10 reps;Supine Heel Slides: AAROM;Left;10 reps;Supine Hip ABduction/ADduction: AAROM;Left;10 reps;Supine    General Comments        Pertinent Vitals/Pain Pain Assessment: No/denies pain Pain Score: 3  Pain Location: L LE Pain Descriptors / Indicators: Aching Pain Intervention(s): Limited activity within patient's tolerance    Home Living Family/patient expects to be discharged to:: Private residence Living Arrangements: Other (Comment) Available Help at Discharge: Family Type of Home: House Home Access: Level entry   Home Layout: One level Home  Equipment: Bedside commode (3 wheeled walker with brakes.)      Prior Function Level of Independence: Independent with assistive device(s)      Comments: Pt. was driving, and working as a Production designer, theatre/television/filmmanager at [employer].   PT Goals (current goals can now be found in the care plan section) Acute Rehab PT Goals Patient Stated Goal: to go home Progress towards PT goals: Progressing toward goals    Frequency   BID    PT Plan Current plan remains appropriate    Co-evaluation             End of Session     Patient left: in bed;with call bell/phone within reach;with bed alarm set     Time: 1315-1347 PT Time Calculation (min) (ACUTE ONLY): 32 min  Charges:  $Gait Training: 8-22 mins $Therapeutic Exercise: 8-22 mins                    G Codes:      Danielle DessSarah Shahrukh Pasch, PTA 09/06/2015, 1:57 PM

## 2015-09-06 NOTE — Anesthesia Postprocedure Evaluation (Signed)
Anesthesia Post Note  Patient: [name] [name]  Procedure(s) Performed: Procedure(s) (LRB): TOTAL HIP ARTHROPLASTY ANTERIOR APPROACH (Left)  Patient location during evaluation: Nursing Unit Anesthesia Type: Combined General/Spinal Level of consciousness: awake and alert and oriented Pain management: pain level controlled Vital Signs Assessment: post-procedure vital signs reviewed and stable Respiratory status: spontaneous breathing Cardiovascular status: stable Postop Assessment: no headache Anesthetic complications: no Comments: Pt states they felt no pain during surgery.    Last Vitals:  Filed Vitals:   09/05/15 1917 09/06/15 0334  BP: 128/79 116/67  Pulse: 97 109  Temp: 37 C 37.4 C  Resp: 18 18    Last Pain:  Filed Vitals:   09/06/15 0657  PainSc: 5                  Tradarius Reinwald,  Alessandra BevelsJennifer M

## 2015-09-06 NOTE — Progress Notes (Signed)
Clinical Social Worker (CSW) received SNF consult. PT is recommending home health. RN Case Manager is aware of above. Please reconsult if future social work needs arise. CSW signing off.   Laury Huizar Morgan, LCSW [phone] 

## 2015-09-06 NOTE — Progress Notes (Signed)
Physical Therapy Treatment Patient Details Name: [name] BottsCathy L Batiz MRN: [id number] DOB: 01/12/1961 Today's Date: 09/06/2015    History of Present Illness Pt admitted for L THR on 09/05/2015.     PT Comments    Pt is progressing towards goals. Pt reported L LE very stiff and painful today. Pt able to perform bed mobility and with min A. Pt able to perform transfer using RW and min A. Pt able to ambulate approx 50 ft using RW and mod assist, requiring frequent rest breaks d/t pain. Pt performed supine there-ex with mod to no assist. Pt demonstrates deficits in strength, ROM and mobility. Pt would benefit from further skilled PT to address deficits; recommend pt receive home health PT after discharge from acute hospitalization.   Follow Up Recommendations  Home health PT     Equipment Recommendations       Recommendations for Other Services       Precautions / Restrictions Precautions Precautions: Fall;Anterior Hip Precaution Booklet Issued: Yes (comment) Restrictions Weight Bearing Restrictions: Yes LLE Weight Bearing: Weight bearing as tolerated    Mobility  Bed Mobility Overal bed mobility: Needs Assistance Bed Mobility: Supine to Sit     Supine to sit: Min assist     General bed mobility comments: Pt required min assist and use of bed rails with bed mobility. Pt required assist with L LE and cues for hand placement. Pt required increased time to sit at EOB d/t pain.   Transfers Overall transfer level: Needs assistance Equipment used: Rolling walker (2 wheeled) Transfers: Sit to/from Stand Sit to Stand: Min assist         General transfer comment: Pt able to perform transfer from sit to stand using RW and min assist. Pt slow to rise and to maintain WB in L LE.   Ambulation/Gait Ambulation/Gait assistance: Mod assist Ambulation Distance (Feet): 50 Feet Assistive device: Rolling walker (2 wheeled) Gait Pattern/deviations: Step-to pattern Gait velocity: slow    General Gait Details: Pt able to ambulate using RW and mod assist. Pt demonstrated slow step-to gait pattern and increased use of B UE on RW. Pt provided cues regarding walker and foot placement. Pt had to take several rest breaks d/t pain.    Stairs            Wheelchair Mobility    Modified Rankin (Stroke Patients Only)       Balance                                    Cognition Arousal/Alertness: Awake/alert Behavior During Therapy: WFL for tasks assessed/performed Overall Cognitive Status: Within Functional Limits for tasks assessed                      Exercises Other Exercises Other Exercises: Pt performed supine ther-ex on L LE including ankle pumps, glute sets, and quad sets with no assist, SLR with mod assist. All ther-ex performed x12 reps.     General Comments        Pertinent Vitals/Pain Pain Assessment: 0-10 Pain Score: 7  Pain Location: L LE Pain Descriptors / Indicators: Operative site guarding Pain Intervention(s): Limited activity within patient's tolerance;RN gave pain meds during session    Home Living                      Prior Function  PT Goals (current goals can now be found in the care plan section) Acute Rehab PT Goals Patient Stated Goal: to go home PT Goal Formulation: With patient Time For Goal Achievement: 09/19/15 Potential to Achieve Goals: Good Progress towards PT goals: Progressing toward goals    Frequency  BID    PT Plan Current plan remains appropriate    Co-evaluation             End of Session Equipment Utilized During Treatment: Gait belt Activity Tolerance: Patient tolerated treatment well Patient left: in chair;with call bell/phone within reach;with chair alarm set     Time: 1610-9604 PT Time Calculation (min) (ACUTE ONLY): 32 min  Charges:                       G Codes:      Dorita Fray 2015/09/16, 9:38 AM M. Hettie Holstein, SPT

## 2015-09-06 NOTE — Progress Notes (Signed)
   Subjective: 1 Day Post-Op Procedure(s) (LRB): TOTAL HIP ARTHROPLASTY ANTERIOR APPROACH (Left) Patient reports pain as moderate.   Patient is well, and has had no acute complaints or problems Denies any CP, SOB, ABD pain. We will continue therapy today.  Plan is to go Home after hospital stay.  Objective: Vital signs in last 24 hours: Temp:  [97.2 F (36.2 C)-99.3 F (37.4 C)] 99.3 F (37.4 C) (04/28 0334) Pulse Rate:  [62-109] 109 (04/28 0334) Resp:  [12-21] 18 (04/28 0334) BP: (104-154)/(67-95) 116/67 mmHg (04/28 0334) SpO2:  [99 %-100 %] 99 % (04/28 0334) Weight:  [83.008 kg (183 lb)] 83.008 kg (183 lb) (04/27 0831)  Intake/Output from previous day: 04/27 0701 - 04/28 0700 In: 2808.8 [P.O.:360; I.V.:2248.8; IV Piggyback:200] Out: 3595 [Urine:3245; Drains:50; Blood:300] Intake/Output this shift:     Recent Labs  09/05/15 1446  HGB 11.7*    Recent Labs  09/05/15 1446  WBC 11.4*  RBC 3.82  HCT 34.6*  PLT 323    Recent Labs  09/05/15 1446  CREATININE 0.50   No results for input(s): LABPT, INR in the last 72 hours.  EXAM General - Patient is Alert, Appropriate and Oriented Extremity - Neurovascular intact Sensation intact distally Intact pulses distally Dorsiflexion/Plantar flexion intact Dressing - dressing C/D/I, no drainage and hemovac intact Motor Function - intact, moving foot and toes well on exam.   Past Medical History  Diagnosis Date  . Hypertension   . Heart murmur   . Depression   . GERD (gastroesophageal reflux disease)   . Arthritis     Assessment/Plan:   1 Day Post-Op Procedure(s) (LRB): TOTAL HIP ARTHROPLASTY ANTERIOR APPROACH (Left) Active Problems:   Avascular necrosis of hip (HCC)  Estimated body mass index is 28.66 kg/(m^2) as calculated from the following:   Height as of this encounter: 5\' 7"  (1.702 m).   Weight as of this encounter: 83.008 kg (183 lb). Advance diet Up with therapy  Needs BM Recheck labs in the  am  DVT Prophylaxis - Aspirin, Lovenox, Foot Pumps and TED hose Weight-Bearing as tolerated to left leg D/C O2 and Pulse OX and try on Room Air  T. Cranston Neighborhris Gaines, PA-C [facility] 09/06/2015, 7:52 AM

## 2015-09-07 LAB — BASIC METABOLIC PANEL
Anion gap: 8 (ref 5–15)
BUN: 8 mg/dL (ref 6–20)
CALCIUM: 8.5 mg/dL — AB (ref 8.9–10.3)
CO2: 30 mmol/L (ref 22–32)
CREATININE: 0.52 mg/dL (ref 0.44–1.00)
Chloride: 95 mmol/L — ABNORMAL LOW (ref 101–111)
GFR calc Af Amer: >60 mL/min (ref >60)
Glucose, Bld: 129 mg/dL — ABNORMAL HIGH (ref 65–99)
Potassium: 3.1 mmol/L — ABNORMAL LOW (ref 3.5–5.1)
SODIUM: 133 mmol/L — AB (ref 135–145)

## 2015-09-07 LAB — CBC
HCT: 31.1 % — ABNORMAL LOW (ref 35.0–47.0)
Hemoglobin: 10.5 g/dL — ABNORMAL LOW (ref 12.0–16.0)
MCH: 31.2 pg (ref 26.0–34.0)
MCHC: 33.9 g/dL (ref 32.0–36.0)
MCV: 92.1 fL (ref 80.0–100.0)
PLATELETS: 259 10*3/uL (ref 150–440)
RBC: 3.38 MIL/uL — ABNORMAL LOW (ref 3.80–5.20)
RDW: 13.2 % (ref 11.5–14.5)
WBC: 11.4 10*3/uL — ABNORMAL HIGH (ref 3.6–11.0)

## 2015-09-07 LAB — URINE CULTURE

## 2015-09-07 MED ORDER — POTASSIUM CHLORIDE 20 MEQ PO PACK
20.0000 meq | PACK | Freq: Three times a day (TID) | ORAL | Status: DC
  Administered 2015-09-07 – 2015-09-08 (×4): 20 meq via ORAL
  Filled 2015-09-07 (×4): qty 1

## 2015-09-07 MED ORDER — LACTULOSE 10 GM/15ML PO SOLN
10.0000 g | Freq: Two times a day (BID) | ORAL | Status: DC | PRN
  Administered 2015-09-07: 10 g via ORAL
  Filled 2015-09-07: qty 30

## 2015-09-07 NOTE — Progress Notes (Signed)
Subjective: 2 Days Post-Op Procedure(s) (LRB): TOTAL HIP ARTHROPLASTY ANTERIOR APPROACH (Left) Patient reports pain as mild.   Patient is well, and has had no acute complaints or problems Continue with physical  therapy today.  Plan is to go Home after hospital stay. no nausea and no vomiting Patient denies any chest pains or shortness of breath. Objective: Vital signs in last 24 hours: Temp:  [99 F (37.2 C)-100.5 F (38.1 C)] 99 F (37.2 C) (04/29 0403) Pulse Rate:  [94-115] 94 (04/29 0403) Resp:  [16-18] 16 (04/29 0403) BP: (110-133)/(68-76) 110/76 mmHg (04/29 0403) SpO2:  [97 %-100 %] 100 % (04/29 0403) well approximated incision Heels are non tender and elevated off the bed using rolled towels Intake/Output from previous day: 04/28 0701 - 04/29 0700 In: 120 [P.O.:120] Out: 920 [Urine:875; Drains:45] Intake/Output this shift:     Recent Labs  09/05/15 1446 09/07/15 0409  HGB 11.7* 10.5*    Recent Labs  09/05/15 1446 09/07/15 0409  WBC 11.4* 11.4*  RBC 3.82 3.38*  HCT 34.6* 31.1*  PLT 323 259    Recent Labs  09/05/15 1446 09/07/15 0409  NA  --  133*  K  --  3.1*  CL  --  95*  CO2  --  30  BUN  --  8  CREATININE 0.50 0.52  GLUCOSE  --  129*  CALCIUM  --  8.5*   No results for input(s): LABPT, INR in the last 72 hours.  EXAM General - Patient is Alert, Appropriate and Oriented Extremity - Neurologically intact Neurovascular intact Sensation intact distally Intact pulses distally Dorsiflexion/Plantar flexion intact Dressing - scant drainage Motor Function - intact, moving foot and toes well on exam.    Past Medical History  Diagnosis Date  . Hypertension   . Heart murmur   . Depression   . GERD (gastroesophageal reflux disease)   . Arthritis     Assessment/Plan: 2 Days Post-Op Procedure(s) (LRB): TOTAL HIP ARTHROPLASTY ANTERIOR APPROACH (Left) Active Problems:   Avascular necrosis of hip (HCC)  Estimated body mass index is  28.66 kg/(m^2) as calculated from the following:   Height as of this encounter: 5' 7" (1.702 m).   Weight as of this encounter: 83.008 kg (183 lb). Up with therapy Plan for discharge tomorrow Discharge home with home health  Labs: reviewed K+ 3.1 Na improving. Asymptomatic  DVT Prophylaxis - Lovenox, Foot Pumps and TED hose Weight-Bearing as tolerated to right leg Klor Con added. Met B in am Pt needs a bowel movement   Jon R. Wolfe PA [facility] 09/07/2015, 7:30 AM    

## 2015-09-07 NOTE — Plan of Care (Signed)
Problem: Acute Rehab OT Goals (only OT should resolve) Goal: Pt. Will Transfer To Toilet Outcome: Progressing   09/07/15 1223  ADL Goals  Pt Will Transfer to Toilet with modified independence, currently requires Min guard

## 2015-09-07 NOTE — Progress Notes (Signed)
PT Cancellation Note  Patient Details Name: Wess BottsCathy L Raffel MRN: [id number] DOB: 12/22/1960   Cancelled Treatment:    Reason Eval/Treat Not Completed: Medical issues which prohibited therapy. After performing chart review, it was determined that physical therapy is contraindicated at this time due to potassium levels. Will check back later to see if treatment can be resumed.   Neita CarpJulie Ann Kristjan Derner, PT, DPT 09/07/2015, 8:05 AM

## 2015-09-07 NOTE — Progress Notes (Signed)
Occupational Therapy Treatment Patient Details Name: [name] BottsCathy L [name] MRN: [id number] DOB: 10/04/1960 Today's Date: 09/07/2015    History of present illness Pt admitted for L THR on 09/05/2015.    OT comments  Pt is a 55 yo female s/p L THR on 4/27. Tolerated OT session well. Improving functional mobility. Pt educated in ECS and home safety using RW to support safety and performance including transfers and home set up modifications to minimize reaching and bending to promote safety.   Follow Up Recommendations  Home health OT    Equipment Recommendations  Tub/shower seat    Recommendations for Other Services      Precautions / Restrictions Precautions Precautions: Fall;Anterior Hip Precaution Comments: OT reviewed precautions with Pt, who verbalized understanding Restrictions Weight Bearing Restrictions: Yes LLE Weight Bearing: Weight bearing as tolerated       Mobility Bed Mobility Overal bed mobility: Needs Assistance Bed Mobility: Supine to Sit;Sit to Supine     Supine to sit: Min guard Sit to supine: Min guard   General bed mobility comments: Pt required Min guard for LLE during supine<>sit transfers  Transfers Overall transfer level: Needs assistance Equipment used: Rolling walker (2 wheeled) Transfers: Sit to/from Stand Sit to Stand: Min guard         General transfer comment: Pt able to perform sit<>stand transfer using RW and Min guard with 1 VC for hand placement on bed to improve safety and performance.    Balance Overall balance assessment: Needs assistance Sitting-balance support: Feet supported Sitting balance-Leahy Scale: Fair     Standing balance support: Bilateral upper extremity supported Standing balance-Leahy Scale: Fair                     ADL Overall ADL's : Needs assistance/impaired                         Toilet Transfer: Min Nurse, learning disabilityguard;BSC;RW Toilet Transfer Details (indicate cue type and reason): Pt performed toilet  transfer to BSC using RW and Min guardx1, no LOB noted, 1 VC for hand placement prior to sitting with demo'd understanding Toileting- Clothing Manipulation and Hygiene: Supervision/safety Toileting - Clothing Manipulation Details (indicate cue type and reason): Pt managed hospital gown with supervision for safety     Functional mobility during ADLs: Min guard;Rolling walker General ADL Comments: Pt politely declined using AE for dressing tasks reporting she's doing her own dressing already, requires supervision for toileting task, Min guard for toilet transfer using RW      Vision                     Perception     Praxis      Cognition   Behavior During Therapy: [facility]FL for tasks assessed/performed Overall Cognitive Status: Within Functional Limits for tasks assessed                       Extremity/Trunk Assessment               Exercises     Shoulder Instructions       General Comments      Pertinent Vitals/ Pain       Pain Assessment: 0-10 Pain Score: 3  Pain Location: LLE Pain Descriptors / Indicators: Aching Pain Intervention(s): Limited activity within patient's tolerance  Home Living  Prior Functioning/Environment              Frequency Min 2X/week     Progress Toward Goals  OT Goals(current goals can now be found in the care plan section)  Progress towards OT goals: Progressing toward goals  Acute Rehab OT Goals Patient Stated Goal: to go home OT Goal Formulation: With patient Potential to Achieve Goals: Good  Plan Frequency remains appropriate    Co-evaluation                 End of Session Equipment Utilized During Treatment: Gait belt;Rolling walker   Activity Tolerance Patient tolerated treatment well   Patient Left in bed;with call bell/phone within reach;with bed alarm set   Nurse Communication          Time: 8295-6213 OT Time Calculation  (min): 32 min  Charges: OT General Charges $OT Visit: 1 Procedure OT Treatments $Self Care/Home Management : 23-37 mins  Eliezer Bottom, OTR/L 09/07/2015, 12:15 PM

## 2015-09-07 NOTE — Progress Notes (Signed)
Physical Therapy Treatment Patient Details Name: [name] [name] MRN: [id number] DOB: 1960/09/17 Today's Date: 09/07/2015    History of Present Illness Pt admitted for L THR on 09/05/2015.     PT Comments    PT attempted treatment this afternoon, despite K+ levels; however, gait was chosen to be deferred. Patient demonstrates knowledge of HEP to allow for improved strength and mobility. CGA utilized to transfer to chair at bedside with safe sequencing, maintaining precautions.  Follow Up Recommendations  Home health PT     Equipment Recommendations       Recommendations for Other Services       Precautions / Restrictions Precautions Precautions: Fall;Anterior Hip Precaution Booklet Issued: No Precaution Comments: OT reviewed precautions with Pt, who verbalized understanding Restrictions Weight Bearing Restrictions: Yes LLE Weight Bearing: Weight bearing as tolerated    Mobility  Bed Mobility Overal bed mobility: Needs Assistance Bed Mobility: Supine to Sit     Supine to sit: Min guard Sit to supine: Min guard   General bed mobility comments: Patient required CGA for bed mobility. Was able to maintain precautions while moving to EOB.  Transfers Overall transfer level: Needs assistance Equipment used: Rolling walker (2 wheeled) Transfers: Sit to/from [employer] Sit to Stand: Min guard Stand pivot transfers: Min guard       General transfer comment: Patient able to perform sit to stand and stand to sit transfers in safe sequencing. Showed ability to pivot without breaking precautions to transfer to chair at bedside.  Ambulation/Gait                 Stairs            Wheelchair Mobility    Modified Rankin (Stroke Patients Only)       Balance Overall balance assessment: Needs assistance Sitting-balance support: Feet supported Sitting balance-Leahy Scale: Good     Standing balance support: Bilateral upper extremity  supported Standing balance-Leahy Scale: Good                      Cognition Arousal/Alertness: Awake/alert Behavior During Therapy: WFL for tasks assessed/performed Overall Cognitive Status: Within Functional Limits for tasks assessed                      Exercises General Exercises - Lower Extremity Ankle Circles/Pumps: AROM;20 reps Quad Sets: AROM;20 reps Gluteal Sets: AROM;20 reps Short Arc Quad: AROM;15 reps Hip ABduction/ADduction: AROM;15 reps Straight Leg Raises: AAROM;15 reps    General Comments        Pertinent Vitals/Pain Pain Assessment: 0-10 Pain Score: 2  Pain Location: L hip Pain Descriptors / Indicators: Aching Pain Intervention(s): Limited activity within patient's tolerance;Monitored during session    Home Living                      Prior Function            PT Goals (current goals can now be found in the care plan section) Acute Rehab PT Goals Patient Stated Goal: "To go home" PT Goal Formulation: With patient Time For Goal Achievement: 09/19/15 Potential to Achieve Goals: Good Progress towards PT goals: Progressing toward goals    Frequency  BID    PT Plan Current plan remains appropriate    Co-evaluation             End of Session Equipment Utilized During Treatment: Gait belt Activity Tolerance: Patient tolerated treatment well Patient left:  in chair;with call bell/phone within reach;with chair alarm set;with family/visitor present     Time: 1245-1300 PT Time Calculation (min) (ACUTE ONLY): 15 min  Charges:  $Therapeutic Exercise: 8-22 mins                    G Codes:      Neita CarpJulie Ann Dawnisha Marquina, PT, DPT 09/07/2015, 2:27 PM

## 2015-09-08 LAB — BASIC METABOLIC PANEL
ANION GAP: 7 (ref 5–15)
BUN: 11 mg/dL (ref 6–20)
CALCIUM: 8.2 mg/dL — AB (ref 8.9–10.3)
CHLORIDE: 97 mmol/L — AB (ref 101–111)
CO2: 28 mmol/L (ref 22–32)
CREATININE: 0.42 mg/dL — AB (ref 0.44–1.00)
GFR calc non Af Amer: >60 mL/min (ref >60)
Glucose, Bld: 124 mg/dL — ABNORMAL HIGH (ref 65–99)
Potassium: 4.1 mmol/L (ref 3.5–5.1)
SODIUM: 132 mmol/L — AB (ref 135–145)

## 2015-09-08 MED ORDER — HYDROCODONE-ACETAMINOPHEN 5-325 MG PO TABS: 1.0000 | ORAL_TABLET | ORAL | Status: AC | PRN

## 2015-09-08 MED ORDER — ENOXAPARIN SODIUM 40 MG/0.4ML ~~LOC~~ SOLN: 40.0000 mg | SUBCUTANEOUS | Status: DC

## 2015-09-08 NOTE — Care Management Note (Signed)
Case Management Note  Patient Details  Name: Wess BottsCathy L Schey MRN: [id number] Date of Birth: 09/26/1960  Subjective/Objective:    [facility] Ms Janit Paganberlin already has a RW and a BSC at home. Has Lovenox coupon and case managers phone number to call if she has any problem getting her Lovenox prescription filled today.                 Action/Plan:   Expected Discharge Date:                  Expected Discharge Plan:     In-House Referral:     Discharge planning Services     Post Acute Care Choice:    Choice offered to:     DME Arranged:    DME Agency:     HH Arranged:    HH Agency:     Status of Service:     Medicare Important Message Given:    Date Medicare IM Given:    Medicare IM give by:    Date Additional Medicare IM Given:    Additional Medicare Important Message give by:     If discussed at Long Length of Stay Meetings, dates discussed:    Additional Comments:  Salimah Martinovich A, RN 09/08/2015, 8:43 AM

## 2015-09-08 NOTE — Discharge Summary (Signed)
Physician Discharge Summary  Patient ID: [name] BottsCathy L [name] MRN: [id number] DOB/AGE: 56/09/1960 56 y.o.  Admit date: 09/05/2015 Discharge date: 09/08/2015  Admission Diagnoses:  OSTEOARTHRITIS,AVASCULAR NECROSIS   Discharge Diagnoses: Patient Active Problem List   Diagnosis Date Noted  . Avascular necrosis of hip (HCC) 09/05/2015  . Open wound of right great toe without damage to nail 02/18/2015    Past Medical History  Diagnosis Date  . Hypertension   . Heart murmur   . Depression   . GERD (gastroesophageal reflux disease)   . Arthritis      Transfusion: No transfusions given doing this admission   Consultants (if any):   case management for home health assistance  Discharged Condition: Improved  Hospital Course: [name] [name] is an 55 y.o. 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    Surgeries:Procedure(s): TOTAL HIP ARTHROPLASTY ANTERIOR APPROACH on 09/05/2015  PRE-OPERATIVE DIAGNOSIS: left hip -AVASCULAR NECROSIS  POST-OPERATIVE DIAGNOSIS: Left hip avascular necrosis  PROCEDURE: Procedure(s): TOTAL HIP ARTHROPLASTY ANTERIOR APPROACH (Left)  SURGEON: Leitha SchullerMichael J Menz, MD  ASSISTANTS: None  ANESTHESIA: spinal  EBL: Total I/O In: 1025 [I.V.:1025] Out: 1200 [Urine:900; Blood:300]  BLOOD ADMINISTERED:none  DRAINS: (2) Hemovact drain(s) in the Subcutaneous layer with Suction Open   LOCAL MEDICATIONS USED: MARCAINE   SPECIMEN: Source of Specimen: Left femoral head  DISPOSITION OF SPECIMEN: PATHOLOGY Patient tolerated the surgery well. No complications .Patient was taken to PACU where she was stabilized and then transferred to the orthopedic floor.  Patient started on Lovenox 30 q 12 hrs. Foot pumps applied bilaterally at 80 mm hg. Heels elevated off bed with rolled towels. No evidence of DVT. Calves  non tender. Negative Homan. Physical therapy started on day #1 for gait training and transfer with OT starting on  day #1 for ADL and assisted devices. Patient has done well with therapy. Ambulated greater than 200 feet upon being discharged. Was able to go up and down 4 steps independently and safely.  Patient's IV and Foley were discontinued on day #1 with the Hemovac being just continued on day #2. Dressing was changed on day #3.   She was given perioperative antibiotics:  Anti-infectives    Start     Dose/Rate Route Frequency Ordered Stop   09/05/15 1330  ceFAZolin (ANCEF) IVPB 2g/100 mL premix     2 g 200 mL/hr over 30 Minutes Intravenous Every 6 hours 09/05/15 1328 09/06/15 0552   09/05/15 0753  ceFAZolin (ANCEF) 2-4 GM/100ML-% IVPB    Comments:  KENNEDY, DENISE: cabinet override      09/05/15 0753 09/05/15 1959   09/05/15 0215  ceFAZolin (ANCEF) IVPB 2g/100 mL premix     2 g 200 mL/hr over 30 Minutes Intravenous  Once 09/05/15 0214 09/05/15 1053    .  She was fitted with AV 1 compression foot pump devices, instructed on heel pumps, early ambulation, and fitted with TED stockings bilaterally for DVT prophylaxis.  She benefited maximally from the hospital stay and there were no complications.    Recent vital signs:  Filed Vitals:   09/07/15 2005 09/08/15 0349  BP: 108/66 95/53  Pulse: 99 86  Temp: 98.8 F (37.1 C) 98.6 F (37 C)  Resp: 18     Recent laboratory studies:  Lab Results  Component Value Date   HGB 10.5* 09/07/2015   HGB 11.7* 09/05/2015  HGB 12.3 09/02/2015   Lab Results  Component Value Date   WBC 11.4* 09/07/2015   PLT 259 09/07/2015   Lab Results  Component Value Date   INR 1.05 09/02/2015   Lab Results  Component Value Date   NA 132* 09/08/2015   K 4.1 09/08/2015   CL 97* 09/08/2015   CO2 28 09/08/2015   BUN 11 09/08/2015   CREATININE 0.42* 09/08/2015   GLUCOSE 124* 09/08/2015    Discharge Medications:     Medication List     STOP taking these medications        aspirin 81 MG tablet     diclofenac 50 MG EC tablet  Commonly known as:  VOLTAREN      TAKE these medications        acetaminophen 500 MG tablet  Commonly known as:  TYLENOL  Take 1,000 mg by mouth every 6 (six) hours as needed.     diphenhydrAMINE 25 MG tablet  Commonly known as:  BENADRYL  Take 25 mg by mouth daily as needed.     enoxaparin 40 MG/0.4ML injection  Commonly known as:  LOVENOX  Inject 0.4 mLs (40 mg total) into the skin daily.     HYDROcodone-acetaminophen 5-325 MG tablet  Commonly known as:  NORCO/VICODIN  Take 1-2 tablets by mouth every 4 (four) hours as needed (breakthrough pain).     lisinopril-hydrochlorothiazide 10-12.5 MG tablet  Commonly known as:  PRINZIDE,ZESTORETIC  Take 1 tablet by mouth daily.     metoprolol succinate 100 MG 24 hr tablet  Commonly known as:  TOPROL-XL  Take 100 mg by mouth daily. Take with or immediately following a meal.     multivitamin capsule  Take 1 capsule by mouth daily.     potassium gluconate 595 (99 K) MG Tabs tablet  Take 595 mg by mouth daily.     venlafaxine 75 MG tablet  Commonly known as:  EFFEXOR  Take 75 mg by mouth daily.        Diagnostic Studies: Ct Hip Left Wo Contrast  08/28/2015  CLINICAL DATA:  Left hip pain since this is November 2016. EXAM: CT OF THE LEFT HIP WITHOUT CONTRAST TECHNIQUE: Multidetector CT imaging of the left hip was performed according to the standard protocol. Multiplanar CT image reconstructions were also generated. COMPARISON:  None. FINDINGS: There is no acute fracture or dislocation. There is severe joint space narrowing of the left hip with subchondral reactive changes. There is flattening of the left femoral head with the femoral head located slightly more lateral then normally expected. There is calcified synovitis in the left hip joint. There are mild degenerative changes of the visualize left sacroiliac joint. There is no aggressive  lytic or sclerotic osseous lesion. There is no periosteal reaction or bone destruction. The muscles are normal. There is no muscle atrophy. There is no fluid collection or hematoma. IMPRESSION: 1. Severe osteoarthritis of the left hip.  Next item 2. Flattening of the left femoral head with the femoral head located slightly more lateral then normally expected. The changes may reflect the sequela of avascular necrosis with articular surface collapse versus an element of DDH. 3. Calcified synovitis of the left hip. Electronically Signed   By: Elige Ko   On: 08/28/2015 13:31   Dg Hip Operative Unilat W Or W/o Pelvis Left  09/05/2015  CLINICAL DATA:  Left hip arthroplasty EXAM: OPERATIVE LEFT HIP (WITH PELVIS IF PERFORMED) 3 VIEWS TECHNIQUE: Fluoroscopic spot image(s) were submitted for  interpretation post-operatively. COMPARISON:  None. FINDINGS: Three intraoperative fluoroscopic spot images demonstrate interval total left hip arthroplasty without failure complication. No for fracture or dislocation. IMPRESSION: Interval left hip arthroplasty. Electronically Signed   By: Elige Ko   On: 09/05/2015 12:16   Dg Hip Unilat W Or W/o Pelvis 2-3 Views Left  09/05/2015  CLINICAL DATA:  Postop left hip arthroplasty. EXAM: DG HIP (WITH OR WITHOUT PELVIS) 2-3V LEFT COMPARISON:  09/05/2015. FINDINGS: Left hip arthroplasty. Surgical drain and skin staples are in place. Subcutaneous air is noted. IMPRESSION: Left hip arthroplasty with expected postoperative findings. Electronically Signed   By: Leanna Battles M.D.   On: 09/05/2015 13:16    Disposition: Final discharge disposition not confirmed      Discharge Instructions    Diet - low sodium heart healthy    Complete by:  As directed      Increase activity slowly    Complete by:  As directed               Signed: Oryon Gary R. 09/08/2015, 7:29 AM

## 2015-09-08 NOTE — Progress Notes (Signed)
   Subjective: 3 Days Post-Op Procedure(s) (LRB): TOTAL HIP ARTHROPLASTY ANTERIOR APPROACH (Left)  "I'm ready to go home" Patient reports pain as mild.   Patient is well, and has had no acute complaints or problems Continue with physical  therapy today.  Plan is to go Home after hospital stay. no nausea and no vomiting Patient denies any chest pains or shortness of breath. Objective: Vital signs in last 24 hours: Temp:  [97.6 F (36.4 C)-100.4 F (38 C)] 98.6 F (37 C) (04/30 0349) Pulse Rate:  [86-107] 86 (04/30 0349) Resp:  [18] 18 (04/29 2005) BP: (95-130)/(53-74) 95/53 mmHg (04/30 0349) SpO2:  [97 %-100 %] 97 % (04/30 0349) well approximated incision Heels are non tender and elevated off the bed using rolled towels Intake/Output from previous day: 04/29 0701 - 04/30 0700 In: 1200 [P.O.:1200] Out: 500 [Urine:500] Intake/Output this shift:     Recent Labs  09/05/15 1446 09/07/15 0409  HGB 11.7* 10.5*    Recent Labs  09/05/15 1446 09/07/15 0409  WBC 11.4* 11.4*  RBC 3.82 3.38*  HCT 34.6* 31.1*  PLT 323 259    Recent Labs  09/07/15 0409 09/08/15 0329  NA 133* 132*  K 3.1* 4.1  CL 95* 97*  CO2 30 28  BUN 8 11  CREATININE 0.52 0.42*  GLUCOSE 129* 124*  CALCIUM 8.5* 8.2*   No results for input(s): LABPT, INR in the last 72 hours.  EXAM General - Patient is Alert, Appropriate and Oriented Extremity - Neurologically intact Neurovascular intact Sensation intact distally Intact pulses distally Dorsiflexion/Plantar flexion intact Dressing - moderate drainage Motor Function - intact, moving foot and toes well on exam.    Past Medical History  Diagnosis Date  . Hypertension   . Heart murmur   . Depression   . GERD (gastroesophageal reflux disease)   . Arthritis     Assessment/Plan: 3 Days Post-Op Procedure(s) (LRB): TOTAL HIP ARTHROPLASTY ANTERIOR APPROACH (Left) Active Problems:   Avascular necrosis of hip (HCC)  Estimated body mass index  is 28.66 kg/(m^2) as calculated from the following:   Height as of this encounter: 5\' 7"  (1.702 m).   Weight as of this encounter: 83.008 kg (183 lb). Up with therapy Discharge home with home health today  Labs: Reviewed DVT Prophylaxis - Lovenox, Foot Pumps and TED hose Weight-Bearing as tolerated to right leg Patient needs to do the lap around the nurse's desk to do steps prior to being discharged today Please change dressing prior to being discharged today Please give the patient 2 extra honeycomb dressings to take with her.  Lynnda ShieldsJon R. Lincoln HospitalWolfe PA [facility] 09/08/2015, 7:10 AM

## 2015-09-08 NOTE — Progress Notes (Signed)
Physical Therapy Treatment Patient Details Name: [name] [name] MRN: [id number] DOB: [birth year] Today's Date: 09/08/2015    History of Present Illness Pt admitted for L THR on 09/05/2015.     PT Comments    PT able to perform gait/stair training due to normalized blood values. Mild tendency to shrug shoulders/decrease stance time on L with ability to follow verbal cues for correction. Patient demonstrates great safety awareness in gait and stair training, taking resting breaks due to pain as necessary.  Follow Up Recommendations  Home health PT     Equipment Recommendations       Recommendations for Other Services       Precautions / Restrictions Precautions Precautions: Fall;Anterior Hip Restrictions Weight Bearing Restrictions: Yes LLE Weight Bearing: Weight bearing as tolerated    Mobility  Bed Mobility                  Transfers Overall transfer level: Modified independent Equipment used: Rolling walker (2 wheeled) Transfers: Sit to/from Stand Sit to Stand: Modified independent (Device/Increase time)         General transfer comment: Patient performs sit to stand transfer with safe sequencing.  Ambulation/Gait Ambulation/Gait assistance: Modified independent (Device/Increase time) Ambulation Distance (Feet): 250 Feet Assistive device: Rolling walker (2 wheeled)       General Gait Details: Patient demonstrates step through gait pattern with slight decreased stance time on L. Mild tendency to shrug shoulders with verbal cues to relax upper body during gait. Multiple standing rest breaks utilized due to pain.   Stairs Stairs: Yes Stairs assistance: Modified independent (Device/Increase time) Stair Management: Two rails Number of Stairs: 4 General stair comments: Patient demonstrates good safety awareness, following proper sequencing during stair training.  Wheelchair Mobility    Modified Rankin (Stroke Patients Only)       Balance Overall  balance assessment: Modified Independent   Sitting balance-Leahy Scale: Good     Standing balance support: Bilateral upper extremity supported Standing balance-Leahy Scale: Good                      Cognition Arousal/Alertness: Awake/alert Behavior During Therapy: WFL for tasks assessed/performed Overall Cognitive Status: Within Functional Limits for tasks assessed                      Exercises General Exercises - Lower Extremity Ankle Circles/Pumps: AROM;20 reps Long Arc Quad: AROM;20 reps Hip Flexion/Marching: AAROM;15 reps;Both    General Comments        Pertinent Vitals/Pain Pain Assessment: 0-10 Pain Score: 5  Pain Location: L hip Pain Descriptors / Indicators: Shooting Pain Intervention(s): Limited activity within patient's tolerance;Monitored during session;Patient requesting pain meds-RN notified    Home Living                      Prior Function            PT Goals (current goals can now be found in the care plan section) Acute Rehab PT Goals Patient Stated Goal: "To go home" PT Goal Formulation: With patient Time For Goal Achievement: 09/19/15 Potential to Achieve Goals: Good Progress towards PT goals: Progressing toward goals    Frequency  BID    PT Plan Current plan remains appropriate    Co-evaluation             End of Session Equipment Utilized During Treatment: Gait belt Activity Tolerance: Patient tolerated treatment well Patient left: in chair;with call  bell/phone within reach;with chair alarm set     Time: [id number] PT Time Calculation (min) (ACUTE ONLY): 25 min  Charges:  $Gait Training: 23-37 mins                    G Codes:      [name], PT, DPT 09/08/2015, 9:38 AM

## 2015-09-08 NOTE — Discharge Instructions (Signed)
Patient Name Sex DOB SSN  [name] [name]  @GENDER @ 07/13/1960 [id number]    Discharge Planning by LifescapeWOLFE,Fleta Borgeson R.   Author: Tera PartridgeWOLFE,Cagney Steenson R. Service: Orthopedics Author Type: Physician Assistant    Filed: @T @ Note Time: 7:28 AM Status: Signed   Editor: Tera PartridgeWOLFE,Earon Rivest R., PA     Expand All Collapse All   ANTERIOR APPROACH TOTAL HIP REPLACEMENT POSTOPERATIVE DIRECTIONS   Hip Rehabilitation, Guidelines Following Surgery  The results of a hip operation are greatly improved after range of motion and muscle strengthening exercises. Follow all safety measures which are given to protect your hip. If any of these exercises cause increased pain or swelling in your joint, decrease the amount until you are comfortable again. Then slowly increase the exercises. Call your caregiver if you have problems or questions.   HOME CARE INSTRUCTIONS   Remove items at home which could result in a fall. This includes throw rugs or furniture in walking pathways.   ICE to the affected hip every three hours for 30 minutes at a time and then as needed for pain and swelling. Continue to use ice on the hip for pain and swelling from surgery. You may notice swelling that will progress down to the foot and ankle. This is normal after surgery. Elevate the leg when you are not up walking on it.   Continue to use the breathing machine which will help keep your temperature down. It is common for your temperature to cycle up and down following surgery, especially at night when you are not up moving around and exerting yourself. The breathing machine keeps your lungs expanded and your temperature down.  Do not place pillow under knee, focus on keeping the knee straight while resting  DIET You may resume your previous home diet once your are discharged from the hospital.  DRESSING / WOUND CARE / SHOWERING Change the surgical dressing as needed If the wound gets wet inside, change the dressing with sterile gauze. Please  use good hand washing techniques before changing the dressing. Do not use any lotions or creams on the incision until instructed by your surgeon.Keep your dressing dry with showering.  Keep your dressing dry with showering. You can keep it covered and pat dry.  STAPLE REMOVAL: Please remove staples two weeks post op and apply benzoin and 1/2 inch steri strips  ACTIVITY Walk with your walker as instructed. Use walker as long as suggested by your caregivers. Avoid periods of inactivity such as sitting longer than an hour when not asleep. This helps prevent blood clots.  You may resume a sexual relationship in one month or when given the OK by your doctor.  You may return to work once you are cleared by your doctor.  Do not drive a car for 6 weeks or until released by you surgeon.  Do not drive while taking narcotics.  WEIGHT BEARING Weight bearing as tolerated with assist device (walker, cane, etc) as directed, use it as long as suggested by your surgeon or therapist, typically at least 4-6 weeks.  POSTOPERATIVE CONSTIPATION PROTOCOL Constipation - defined medically as fewer than three stools per week and severe constipation as less than one stool per week.  One of the most common issues patients have following surgery is constipation. Even if you have a regular bowel pattern at home, your normal regimen is likely to be disrupted due to multiple reasons following surgery. Combination of anesthesia, postoperative narcotics, change in appetite and fluid intake all can affect your bowels.  In order to avoid complications following surgery, here are some recommendations in order to help you during your recovery period.  Colace (docusate) - Pick up an over-the-counter form of Colace or another stool softener and take twice a day as long as you are requiring postoperative pain medications. Take with a full glass of water daily. If you experience loose stools or diarrhea, hold the colace  until you stool forms back up. If your symptoms do not get better within 1 week or if they get worse, check with your doctor.  Dulcolax (bisacodyl) - Pick up over-the-counter and take as directed by the product packaging as needed to assist with the movement of your bowels. Take with a full glass of water. Use this product as needed if not relieved by Colace only.   MiraLax (polyethylene glycol) - Pick up over-the-counter to have on hand. MiraLax is a solution that will increase the amount of water in your bowels to assist with bowel movements. Take as directed and can mix with a glass of water, juice, soda, coffee, or tea. Take if you go more than two days without a movement. Do not use MiraLax more than once per day. Call your doctor if you are still constipated or irregular after using this medication for 7 days in a row.  If you continue to have problems with postoperative constipation, please contact the office for further assistance and recommendations. If you experience "the worst abdominal pain ever" or develop nausea or vomiting, please contact the office immediatly for further recommendations for treatment.  ITCHING If you experience itching with your medications, try taking only a single pain pill, or even half a pain pill at a time. You can also use Benadryl over the counter for itching or also to help with sleep.   TED HOSE STOCKINGS Wear the elastic stockings on both legs for 6 weeks following surgery during the day but you may remove then at night for sleeping.  MEDICATIONS See your medication summary on the After Visit Summary that the nursing staff will review with you prior to discharge. You may have some home medications which will be placed on hold until you complete the course of blood thinner medication. It is important for you to complete the blood thinner medication as prescribed by your surgeon. Continue your approved medications as instructed at time of  discharge.  PRECAUTIONS If you experience chest pain or shortness of breath - call 911 immediately for transfer to the hospital emergency department.  If you develop a fever greater that 101 F, purulent drainage from wound, increased redness or drainage from wound, foul odor from the wound/dressing, or calf pain - CONTACT YOUR SURGEON.    FOLLOW-UP APPOINTMENTS Make sure you keep all of your appointments after your operation with your surgeon and caregivers. You should call the office at the above phone number and make an appointment for approximately two weeks after the date of your surgery or on the date instructed by your surgeon outlined in the "After Visit Summary".  RANGE OF MOTION AND STRENGTHENING EXERCISES  These exercises are designed to help you keep full movement of your hip joint. Follow your caregiver's or physical therapist's instructions. Perform all exercises about fifteen times, three times per day or as directed. Exercise both hips, even if you have had only one joint replacement. These exercises can be done on a training (exercise) mat, on the floor, on a table or on a bed. Use whatever works the best and is  most comfortable for you. Use music or television while you are exercising so that the exercises are a pleasant break in your day. This will make your life better with the exercises acting as a break in routine you can look forward to.   Lying on your back, slowly slide your foot toward your buttocks, raising your knee up off the floor. Then slowly slide your foot back down until your leg is straight again.   Lying on your back spread your legs as far apart as you can without causing discomfort.   Lying on your side, raise your upper leg and foot straight up from the floor as far as is comfortable. Slowly lower the leg and repeat.   Lying on your back, tighten up the muscle in the front of your thigh (quadriceps muscles).  You can do this by keeping your leg straight and trying to raise your heel off the floor. This helps strengthen the largest muscle supporting your knee.   Lying on your back, tighten up the muscles of your buttocks both with the legs straight and with the knee bent at a comfortable angle while keeping your heel on the floor.  IF YOU ARE TRANSFERRED TO A SKILLED REHAB FACILITY If the patient is transferred to a skilled rehab facility following release from the hospital, a list of the current medications will be sent to the facility for the patient to continue. When discharged from the skilled rehab facility, please have the facility set up the patient's [place] prior to being released. Also, the skilled facility will be responsible for providing the patient with their medications at time of release from the facility to include their pain medication, the muscle relaxants, and their blood thinner medication. If the patient is still at the rehab facility at time of the two week follow up appointment, the skilled rehab facility will also need to assist the patient in arranging follow up appointment in our office and any transportation needs.  MAKE SURE YOU:   Understand these instructions.   Get help right away if you are not doing well or get worse.   Pick up stool softner and laxative for home use following surgery while on pain medications. Do not submerge incision under water. Please use good hand washing techniques while changing dressing each day. May shower starting three days after surgery. Please use a clean towel to pat the incision dry following showers. Continue to use ice for pain and swelling after surgery. Do not use any lotions or creams on the incision until instructed by your surgeon.

## 2015-09-09 LAB — SURGICAL PATHOLOGY

## 2017-08-09 ENCOUNTER — Other Ambulatory Visit: Payer: Self-pay | Admitting: Family Medicine

## 2017-08-09 DIAGNOSIS — Z1231 Encounter for screening mammogram for malignant neoplasm of breast: Secondary | ICD-10-CM

## 2017-08-23 ENCOUNTER — Ambulatory Visit
Admission: RE | Admit: 2017-08-23 | Discharge: 2017-08-23 | Disposition: A | Discharge status: Home / Self Care | Payer: BLUE CROSS/BLUE SHIELD | Source: Ambulatory Visit | Attending: Family Medicine | Admitting: Family Medicine

## 2017-08-23 DIAGNOSIS — Z1231 Encounter for screening mammogram for malignant neoplasm of breast: Secondary | ICD-10-CM | POA: Insufficient documentation

## 2017-09-28 ENCOUNTER — Other Ambulatory Visit: Payer: Self-pay

## 2017-09-28 ENCOUNTER — Ambulatory Visit
Admission: EM | Admit: 2017-09-28 | Discharge: 2017-09-28 | Disposition: A | Discharge status: Home / Self Care | Payer: BLUE CROSS/BLUE SHIELD | Attending: Family Medicine | Admitting: Family Medicine

## 2017-09-28 ENCOUNTER — Encounter: Payer: Self-pay | Admitting: Emergency Medicine

## 2017-09-28 DIAGNOSIS — L255 Unspecified contact dermatitis due to plants, except food: Secondary | ICD-10-CM

## 2017-09-28 MED ORDER — METOPROLOL SUCCINATE ER 100 MG PO TB24: 100.0000 mg | ORAL_TABLET | Freq: Every day | ORAL | 0 refills | Status: AC

## 2017-09-28 MED ORDER — VENLAFAXINE HCL 75 MG PO TABS: 75.0000 mg | ORAL_TABLET | Freq: Every day | ORAL | 0 refills | Status: AC

## 2017-09-28 MED ORDER — LISINOPRIL-HYDROCHLOROTHIAZIDE 10-12.5 MG PO TABS: 1.0000 | ORAL_TABLET | Freq: Every day | ORAL | 0 refills | Status: AC

## 2017-09-28 MED ORDER — PREDNISONE 10 MG (21) PO TBPK: ORAL_TABLET | Freq: Every day | ORAL | 0 refills | Status: AC

## 2017-09-28 NOTE — ED Provider Notes (Signed)
[facility]    CSN: [id number] Arrival date & time: 09/28/17  0830     History   Chief Complaint Chief Complaint  Patient presents with  . Rash    HPI [name] [name] is a 57 y.o. female.   HPI  57 year old female presents with rash thought to be poison ivy on her right arm right leg and back.  This started over the weekend.  She states that she has been working on her yard last week and is extremely sensitive to poison ivy.  This seems exactly like she has had in the past.  It is very itchy.  Has had no fever or chills.  Also presents to me empty bottles that she would like to have refilled.  She tells me that her SSI disability has just been approved but she has not been assigned to a primary care yet.  Requesting a 30-day supply of each of the 3 medications.          Past Medical History:  Diagnosis Date  . Arthritis   . Depression   . GERD (gastroesophageal reflux disease)   . Heart murmur   . Hypertension     Patient Active Problem List   Diagnosis Date Noted  . Avascular necrosis of hip (HCC) 09/05/2015  . Open wound of right great toe without damage to nail 02/18/2015    Past Surgical History:  Procedure Laterality Date  . BREAST BIOPSY Left 2000   fibroids negative  . CAPSULOTOMY METATARSOPHALANGEAL Left   . HARDWARE REMOVAL Left    toes  . OSTECTOMY METATARSAL Left 07/09/14   fifth head  . TOTAL HIP ARTHROPLASTY Left 09/05/2015   Procedure: TOTAL HIP ARTHROPLASTY ANTERIOR APPROACH;  Surgeon: Kennedy Bucker, MD;  Location: [facility] ORS;  Service: Orthopedics;  Laterality: Left;    OB History   None      Home Medications    Prior to Admission medications   Medication Sig Start Date End Date Taking? Authorizing Provider  aspirin EC 81 MG tablet Take 81 mg by mouth daily.   Yes [provider]  acetaminophen (TYLENOL) 500 MG tablet Take 1,000 mg by mouth every 6 (six) hours as needed.    [provider]    diphenhydrAMINE (BENADRYL) 25 MG tablet Take 25 mg by mouth daily as needed.     [provider]  HYDROcodone-acetaminophen (NORCO/VICODIN) 5-325 MG tablet Take 1-2 tablets by mouth every 4 (four) hours as needed (breakthrough pain). 09/08/15   Tera Partridge, PA  lisinopril-hydrochlorothiazide (PRINZIDE,ZESTORETIC) 10-12.5 MG tablet Take 1 tablet by mouth daily. 09/28/17   Lutricia Feil, PA-C  metoprolol succinate (TOPROL-XL) 100 MG 24 hr tablet Take 1 tablet (100 mg total) by mouth daily. Take with or immediately following a meal. 09/28/17   Lutricia Feil, PA-C  predniSONE (STERAPRED UNI-PAK 21 TAB) 10 MG (21) TBPK tablet Take by mouth daily. Take 6 tabs by mouth daily  for 2 days, then 5 tabs for 2 days, then 4 tabs for 2 days, then 3 tabs for 2 days, 2 tabs for 2 days, then 1 tab by mouth daily for 2 days 09/28/17   Lutricia Feil, PA-C  venlafaxine (EFFEXOR) 75 MG tablet Take 1 tablet (75 mg total) by mouth daily. 09/28/17   Lutricia Feil, PA-C    Family History Family History  Problem Relation Age of Onset  . Breast cancer Mother   . Heart disease Father   . Diabetes Father   .  Hypertension Father     Social History Social History   Tobacco Use  . Smoking status: Former Smoker    Packs/day: 0.25    Years: 4.00    Pack years: 1.00  . Smokeless tobacco: Never Used  Substance Use Topics  . Alcohol use: Yes    Alcohol/week: 3.6 oz    Types: 6 Glasses of wine per week  . Drug use: No     Allergies   Aleve [naproxen] and Oxycodone   Review of Systems Review of Systems  Constitutional: Negative for activity change, appetite change, chills, fatigue and fever.  Skin: Positive for rash.  All other systems reviewed and are negative.    Physical Exam Triage Vital Signs ED Triage Vitals  Enc Vitals Group     BP 09/28/17 0912 132/87     Pulse Rate 09/28/17 0912 63     Resp 09/28/17 0912 14     Temp 09/28/17 0912 98.1 F (36.7 C)     Temp Source  09/28/17 0912 Oral     SpO2 09/28/17 0912 100 %     Weight 09/28/17 0909 185 lb (83.9 kg)     Height 09/28/17 0909 5' 7.5" (1.715 m)     Head Circumference --      Peak Flow --      Pain Score 09/28/17 0909 0     Pain Loc --      Pain Edu? --      Excl. in GC? --    No data found.  Updated Vital Signs BP 132/87 (BP Location: Right Arm)   Pulse 63   Temp 98.1 F (36.7 C) (Oral)   Resp 14   Ht 5' 7.5" (1.715 m)   Wt 185 lb (83.9 kg)   SpO2 100%   BMI 28.55 kg/m   Visual Acuity Right Eye Distance:   Left Eye Distance:   Bilateral Distance:    Right Eye Near:   Left Eye Near:    Bilateral Near:     Physical Exam  Constitutional: She is oriented to person, place, and time. She appears well-developed and well-nourished. No distress.  HENT:  Head: Normocephalic.  Eyes: Pupils are equal, round, and reactive to light. Right eye exhibits no discharge. Left eye exhibits no discharge.  Neck: Normal range of motion.  Musculoskeletal: Normal range of motion.  Neurological: She is alert and oriented to person, place, and time.  Skin: Skin is warm and dry. Rash noted. She is not diaphoretic.  Examination of the left arm shows several small linear vesicular punctate type lesions.  Present on her back and on her leg.  There are areas of excoriation present.  Psychiatric: She has a normal mood and affect. Her behavior is normal. Judgment and thought content normal.  Nursing note and vitals reviewed.    UC Treatments / Results  Labs (all labs ordered are listed, but only abnormal results are displayed) Labs Reviewed - No data to display  EKG None  Radiology No results found.  Procedures Procedures (including critical care time)  Medications Ordered in UC Medications - No data to display  Initial Impression / Assessment and Plan / UC Course  I have reviewed the triage vital signs and the nursing notes.  Pertinent labs & imaging results that were available during my  care of the patient were reviewed by me and considered in my medical decision making (see chart for details).     Plan: 1. Test/x-ray results and diagnosis reviewed  with patient 2. rx as per orders; risks, benefits, potential side effects reviewed with patient 3. Recommend supportive treatment with use of prednisone which has helped her in the past.  We will refill her medications for 30 days pending her appointment with the new primary care 4. F/u prn if symptoms worsen or don't improve  Final Clinical Impressions(s) / UC Diagnoses   Final diagnoses:  Dermatitis due to plants, including poison ivy, sumac, and oak   Discharge Instructions   None    ED Prescriptions    Medication Sig Dispense Auth. Provider   venlafaxine (EFFEXOR) 75 MG tablet Take 1 tablet (75 mg total) by mouth daily. 30 tablet Lutricia Feil, PA-C   lisinopril-hydrochlorothiazide (PRINZIDE,ZESTORETIC) 10-12.5 MG tablet Take 1 tablet by mouth daily. 30 tablet Ovid Curd P, PA-C   metoprolol succinate (TOPROL-XL) 100 MG 24 hr tablet Take 1 tablet (100 mg total) by mouth daily. Take with or immediately following a meal. 30 tablet Ovid Curd P, PA-C   predniSONE (STERAPRED UNI-PAK 21 TAB) 10 MG (21) TBPK tablet Take by mouth daily. Take 6 tabs by mouth daily  for 2 days, then 5 tabs for 2 days, then 4 tabs for 2 days, then 3 tabs for 2 days, 2 tabs for 2 days, then 1 tab by mouth daily for 2 days 42 tablet Lutricia Feil, PA-C     Controlled Substance Prescriptions [place] Controlled Substance Registry consulted? Not Applicable   Lutricia Feil, PA-C 09/28/17 1156

## 2017-09-28 NOTE — ED Triage Notes (Signed)
Patient c/o poison ivy on her right arm, right leg and back that started over the weekend.  Patient states that she has been working out in her yard last week.

## 2018-04-09 IMAGING — DX DG HIP (WITH OR WITHOUT PELVIS) 2-3V*L*
2 series · 2 of 2 positions shown · non-contrast
Comparison: 09/05/2015.

CLINICAL DATA: Postop left hip arthroplasty.

EXAM:
DG HIP (WITH OR WITHOUT PELVIS) 2-3V LEFT

[hip ap]
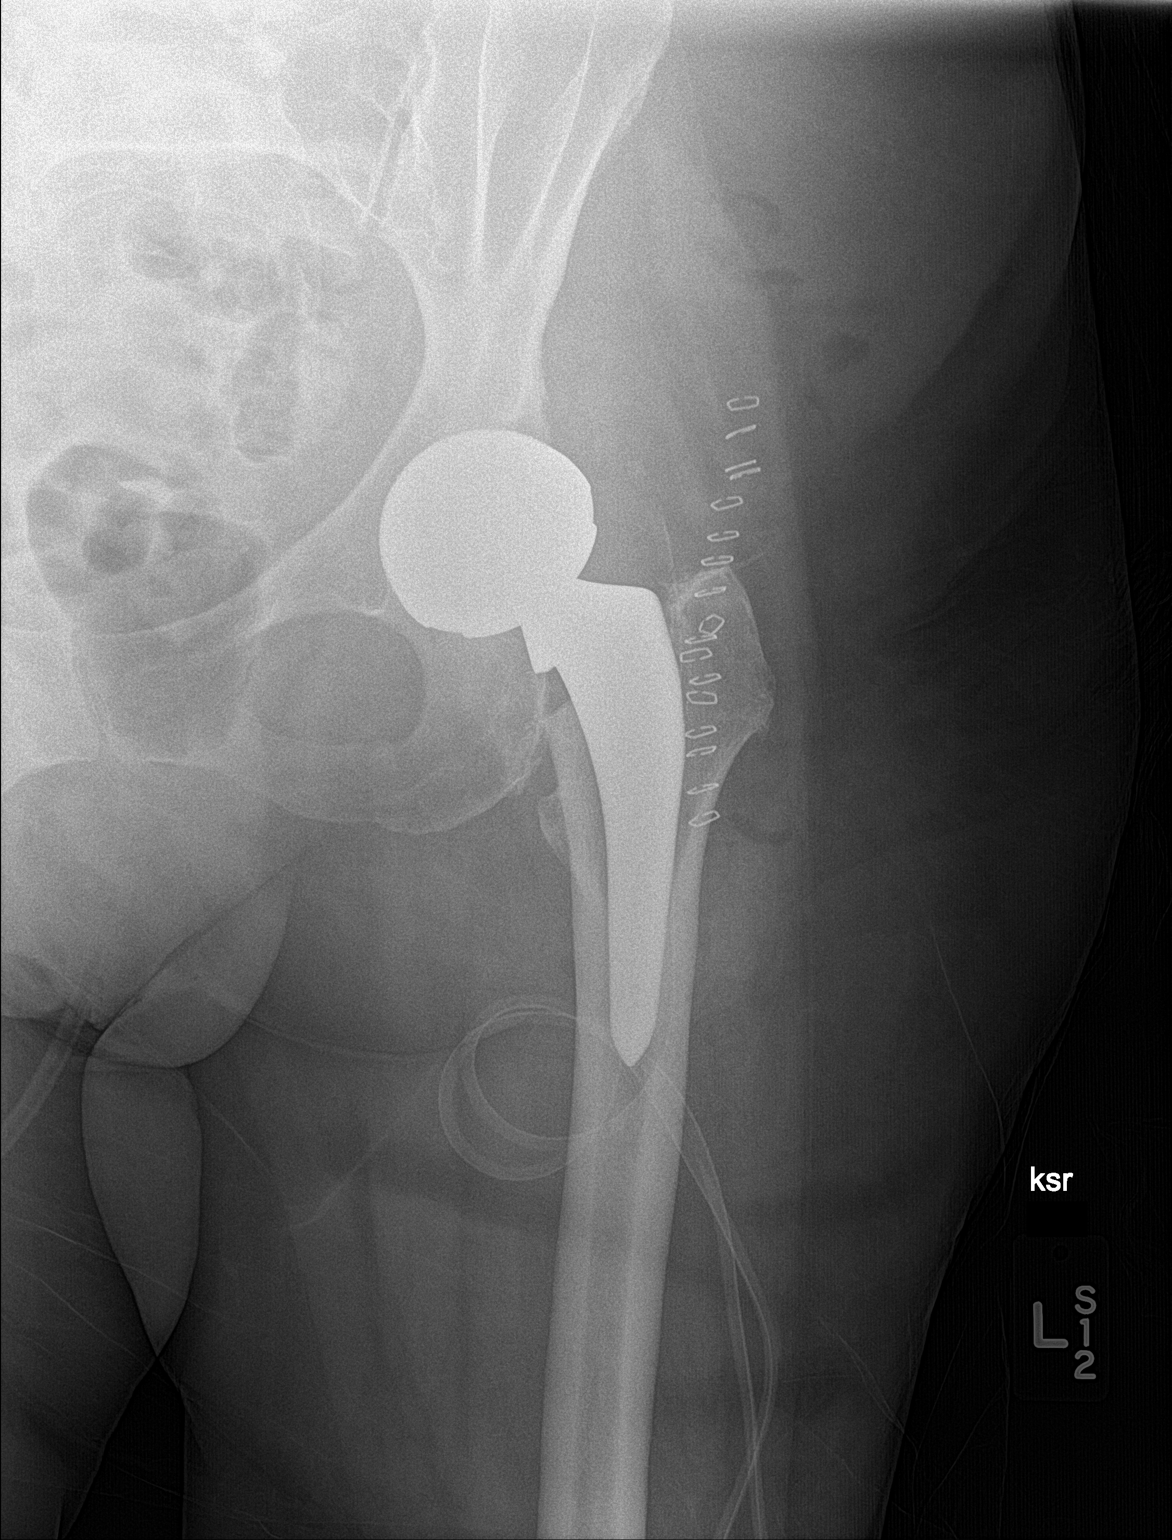

[hip lat]
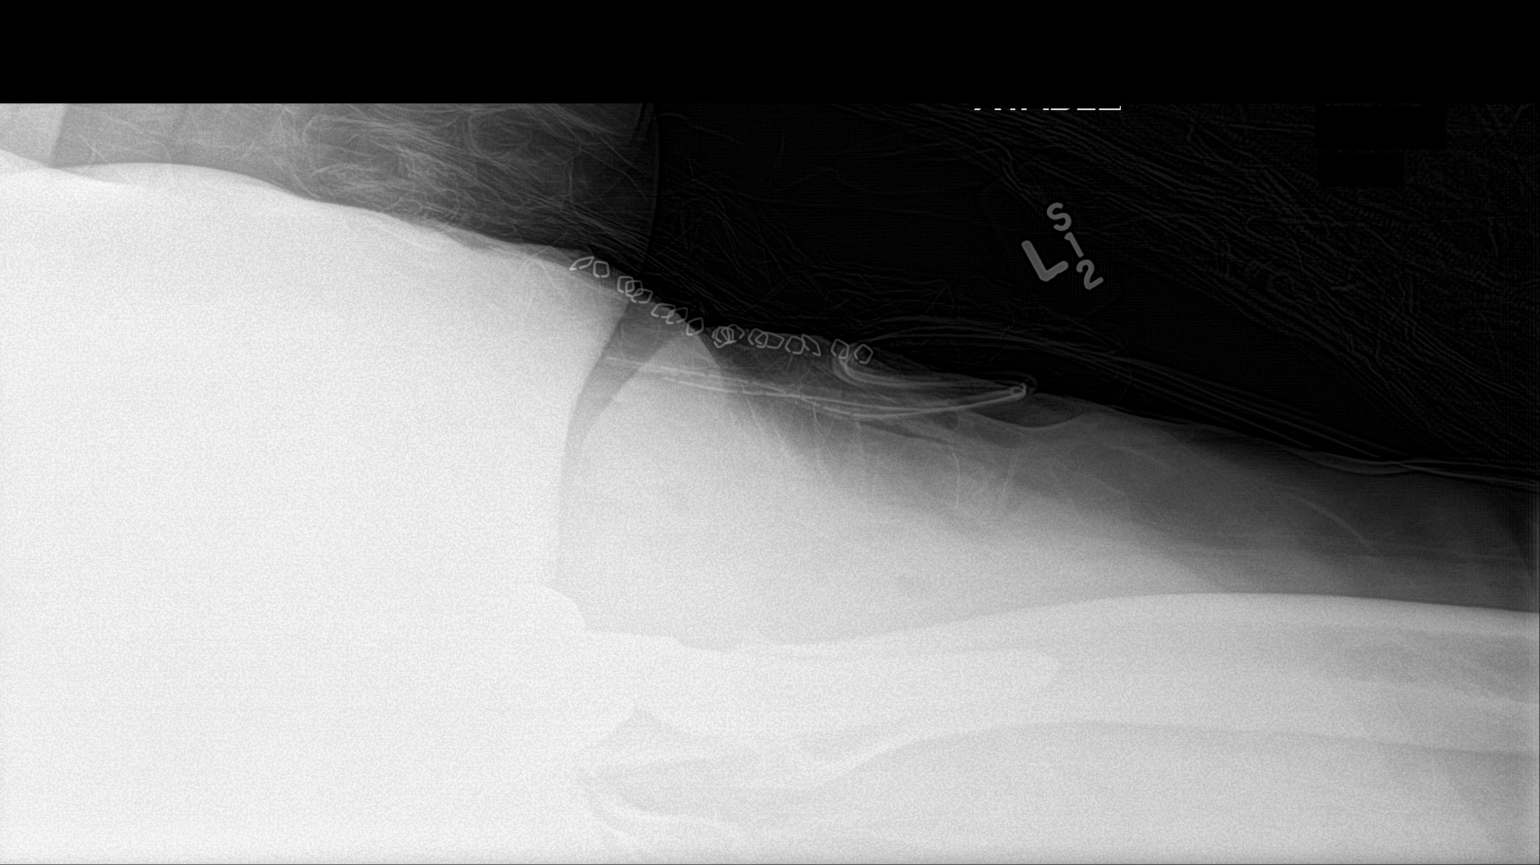

[2 of 2 positions shown; findings below may reference images not displayed]

FINDINGS: Left hip arthroplasty. Surgical drain and skin staples are in place.
Subcutaneous air is noted.
IMPRESSION: Left hip arthroplasty with expected postoperative findings.

## 2020-09-22 ENCOUNTER — Ambulatory Visit: Payer: Self-pay

## 2020-09-22 DIAGNOSIS — M1712 Unilateral primary osteoarthritis, left knee: Secondary | ICD-10-CM | POA: Diagnosis not present

## 2020-09-22 DIAGNOSIS — M25562 Pain in left knee: Secondary | ICD-10-CM | POA: Diagnosis not present

## 2021-05-27 DIAGNOSIS — L0390 Cellulitis, unspecified: Secondary | ICD-10-CM | POA: Diagnosis not present

## 2021-05-27 DIAGNOSIS — Z0000 Encounter for general adult medical examination without abnormal findings: Secondary | ICD-10-CM | POA: Diagnosis not present

## 2021-05-27 DIAGNOSIS — W540XXA Bitten by dog, initial encounter: Secondary | ICD-10-CM | POA: Diagnosis not present

## 2021-05-27 DIAGNOSIS — I10 Essential (primary) hypertension: Secondary | ICD-10-CM | POA: Diagnosis not present

## 2021-05-27 DIAGNOSIS — F32A Depression, unspecified: Secondary | ICD-10-CM | POA: Diagnosis not present

## 2021-05-27 DIAGNOSIS — S81831A Puncture wound without foreign body, right lower leg, initial encounter: Secondary | ICD-10-CM | POA: Diagnosis not present

## 2021-05-30 DIAGNOSIS — S91051D Open bite, right ankle, subsequent encounter: Secondary | ICD-10-CM | POA: Diagnosis not present

## 2021-05-30 DIAGNOSIS — Z5189 Encounter for other specified aftercare: Secondary | ICD-10-CM | POA: Diagnosis not present

## 2021-06-10 DIAGNOSIS — S81851A Open bite, right lower leg, initial encounter: Secondary | ICD-10-CM | POA: Diagnosis not present

## 2021-06-10 DIAGNOSIS — Z4800 Encounter for change or removal of nonsurgical wound dressing: Secondary | ICD-10-CM | POA: Diagnosis not present

## 2021-06-17 DIAGNOSIS — Z4800 Encounter for change or removal of nonsurgical wound dressing: Secondary | ICD-10-CM | POA: Diagnosis not present

## 2021-06-17 DIAGNOSIS — S81851D Open bite, right lower leg, subsequent encounter: Secondary | ICD-10-CM | POA: Diagnosis not present

## 2021-07-02 DIAGNOSIS — Z5189 Encounter for other specified aftercare: Secondary | ICD-10-CM | POA: Diagnosis not present

## 2021-07-02 DIAGNOSIS — L0390 Cellulitis, unspecified: Secondary | ICD-10-CM | POA: Diagnosis not present

## 2021-07-15 DIAGNOSIS — Z1231 Encounter for screening mammogram for malignant neoplasm of breast: Secondary | ICD-10-CM | POA: Diagnosis not present

## 2021-07-29 DIAGNOSIS — F32A Depression, unspecified: Secondary | ICD-10-CM | POA: Diagnosis not present

## 2021-08-06 DIAGNOSIS — Z0000 Encounter for general adult medical examination without abnormal findings: Secondary | ICD-10-CM | POA: Diagnosis not present

## 2021-08-06 DIAGNOSIS — Z124 Encounter for screening for malignant neoplasm of cervix: Secondary | ICD-10-CM | POA: Diagnosis not present

## 2021-08-06 DIAGNOSIS — Z1151 Encounter for screening for human papillomavirus (HPV): Secondary | ICD-10-CM | POA: Diagnosis not present

## 2021-08-06 DIAGNOSIS — Z01419 Encounter for gynecological examination (general) (routine) without abnormal findings: Secondary | ICD-10-CM | POA: Diagnosis not present

## 2021-08-06 DIAGNOSIS — I10 Essential (primary) hypertension: Secondary | ICD-10-CM | POA: Diagnosis not present

## 2021-08-07 DIAGNOSIS — Z1151 Encounter for screening for human papillomavirus (HPV): Secondary | ICD-10-CM | POA: Diagnosis not present

## 2021-08-07 DIAGNOSIS — R87619 Unspecified abnormal cytological findings in specimens from cervix uteri: Secondary | ICD-10-CM | POA: Diagnosis not present

## 2021-08-07 DIAGNOSIS — Z124 Encounter for screening for malignant neoplasm of cervix: Secondary | ICD-10-CM | POA: Diagnosis not present

## 2021-10-30 DIAGNOSIS — I10 Essential (primary) hypertension: Secondary | ICD-10-CM | POA: Diagnosis not present

## 2021-10-30 DIAGNOSIS — I447 Left bundle-branch block, unspecified: Secondary | ICD-10-CM | POA: Diagnosis not present

## 2021-10-30 DIAGNOSIS — M154 Erosive (osteo)arthritis: Secondary | ICD-10-CM | POA: Diagnosis not present

## 2021-10-30 DIAGNOSIS — Z885 Allergy status to narcotic agent status: Secondary | ICD-10-CM | POA: Diagnosis not present

## 2021-10-30 DIAGNOSIS — Z7982 Long term (current) use of aspirin: Secondary | ICD-10-CM | POA: Diagnosis not present

## 2021-10-30 DIAGNOSIS — F32A Depression, unspecified: Secondary | ICD-10-CM | POA: Diagnosis not present

## 2021-10-30 DIAGNOSIS — Z886 Allergy status to analgesic agent status: Secondary | ICD-10-CM | POA: Diagnosis not present

## 2021-10-30 DIAGNOSIS — K5730 Diverticulosis of large intestine without perforation or abscess without bleeding: Secondary | ICD-10-CM | POA: Diagnosis not present

## 2021-10-30 DIAGNOSIS — Z1211 Encounter for screening for malignant neoplasm of colon: Secondary | ICD-10-CM | POA: Diagnosis not present

## 2021-10-30 DIAGNOSIS — F17210 Nicotine dependence, cigarettes, uncomplicated: Secondary | ICD-10-CM | POA: Diagnosis not present

## 2021-10-30 DIAGNOSIS — Z01810 Encounter for preprocedural cardiovascular examination: Secondary | ICD-10-CM | POA: Diagnosis not present

## 2021-10-31 DIAGNOSIS — M20002 Unspecified deformity of left finger(s): Secondary | ICD-10-CM | POA: Diagnosis not present

## 2021-10-31 DIAGNOSIS — M20009 Unspecified deformity of unspecified finger(s): Secondary | ICD-10-CM | POA: Diagnosis not present

## 2021-10-31 DIAGNOSIS — M19042 Primary osteoarthritis, left hand: Secondary | ICD-10-CM | POA: Diagnosis not present

## 2021-10-31 DIAGNOSIS — M19041 Primary osteoarthritis, right hand: Secondary | ICD-10-CM | POA: Diagnosis not present

## 2021-10-31 DIAGNOSIS — I447 Left bundle-branch block, unspecified: Secondary | ICD-10-CM | POA: Diagnosis not present

## 2021-10-31 DIAGNOSIS — M1990 Unspecified osteoarthritis, unspecified site: Secondary | ICD-10-CM | POA: Diagnosis not present

## 2021-10-31 DIAGNOSIS — M20001 Unspecified deformity of right finger(s): Secondary | ICD-10-CM | POA: Diagnosis not present

## 2022-03-03 DIAGNOSIS — I10 Essential (primary) hypertension: Secondary | ICD-10-CM | POA: Diagnosis not present

## 2022-03-03 DIAGNOSIS — F32A Depression, unspecified: Secondary | ICD-10-CM | POA: Diagnosis not present

## 2022-03-03 DIAGNOSIS — Z136 Encounter for screening for cardiovascular disorders: Secondary | ICD-10-CM | POA: Diagnosis not present

## 2022-03-03 DIAGNOSIS — Z23 Encounter for immunization: Secondary | ICD-10-CM | POA: Diagnosis not present

## 2022-03-03 DIAGNOSIS — F17210 Nicotine dependence, cigarettes, uncomplicated: Secondary | ICD-10-CM | POA: Diagnosis not present

## 2022-10-13 DIAGNOSIS — Z0000 Encounter for general adult medical examination without abnormal findings: Secondary | ICD-10-CM | POA: Diagnosis not present

## 2022-10-16 DIAGNOSIS — I10 Essential (primary) hypertension: Secondary | ICD-10-CM | POA: Diagnosis not present

## 2022-10-16 DIAGNOSIS — F17210 Nicotine dependence, cigarettes, uncomplicated: Secondary | ICD-10-CM | POA: Diagnosis not present

## 2022-10-16 DIAGNOSIS — F331 Major depressive disorder, recurrent, moderate: Secondary | ICD-10-CM | POA: Diagnosis not present

## 2022-10-16 DIAGNOSIS — Z8349 Family history of other endocrine, nutritional and metabolic diseases: Secondary | ICD-10-CM | POA: Diagnosis not present

## 2022-10-16 DIAGNOSIS — Z0000 Encounter for general adult medical examination without abnormal findings: Secondary | ICD-10-CM | POA: Diagnosis not present

## 2022-10-16 DIAGNOSIS — Z23 Encounter for immunization: Secondary | ICD-10-CM | POA: Diagnosis not present

## 2022-10-21 DIAGNOSIS — F339 Major depressive disorder, recurrent, unspecified: Secondary | ICD-10-CM | POA: Diagnosis not present

## 2022-10-21 DIAGNOSIS — Z5181 Encounter for therapeutic drug level monitoring: Secondary | ICD-10-CM | POA: Diagnosis not present

## 2022-10-21 DIAGNOSIS — F411 Generalized anxiety disorder: Secondary | ICD-10-CM | POA: Diagnosis not present

## 2022-11-04 DIAGNOSIS — F339 Major depressive disorder, recurrent, unspecified: Secondary | ICD-10-CM | POA: Diagnosis not present

## 2023-06-11 ENCOUNTER — Other Ambulatory Visit: Payer: Self-pay | Admitting: Internal Medicine

## 2023-06-11 DIAGNOSIS — R9431 Abnormal electrocardiogram [ECG] [EKG]: Secondary | ICD-10-CM

## 2023-06-25 ENCOUNTER — Ambulatory Visit
Admission: RE | Admit: 2023-06-25 | Discharge: 2023-06-25 | Disposition: A | Discharge status: Home / Self Care | Payer: Self-pay | Source: Ambulatory Visit | Attending: Internal Medicine | Admitting: Internal Medicine

## 2023-06-25 DIAGNOSIS — R9431 Abnormal electrocardiogram [ECG] [EKG]: Secondary | ICD-10-CM | POA: Insufficient documentation
